# Patient Record
Sex: Female | Born: 2000 | Race: Black or African American | Hispanic: No | Marital: Single | Smoking: Current some day smoker
Health system: Southern US, Community
[De-identification: ages and names within clinical notes are randomized; demographics above are authoritative.]

## PROBLEM LIST (undated history)

## (undated) HISTORY — PX: TONSILLECTOMY: SUR1361

---
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
## 2018-03-02 ENCOUNTER — Encounter (HOSPITAL_COMMUNITY): Payer: Self-pay | Admitting: Emergency Medicine

## 2018-03-02 ENCOUNTER — Emergency Department (HOSPITAL_COMMUNITY)
Admission: EM | Admit: 2018-03-02 | Discharge: 2018-03-02 | Disposition: A | Discharge status: Home / Self Care | Payer: Federal, State, Local not specified - PPO | Attending: Emergency Medicine | Admitting: Emergency Medicine

## 2018-03-02 DIAGNOSIS — Z79899 Other long term (current) drug therapy: Secondary | ICD-10-CM | POA: Insufficient documentation

## 2018-03-02 DIAGNOSIS — N390 Urinary tract infection, site not specified: Secondary | ICD-10-CM | POA: Diagnosis not present

## 2018-03-02 DIAGNOSIS — R300 Dysuria: Secondary | ICD-10-CM | POA: Diagnosis present

## 2018-03-02 DIAGNOSIS — Z3202 Encounter for pregnancy test, result negative: Secondary | ICD-10-CM | POA: Diagnosis not present

## 2018-03-02 LAB — URINALYSIS, ROUTINE W REFLEX MICROSCOPIC
Bacteria, UA: NONE SEEN
Bilirubin Urine: NEGATIVE
Glucose, UA: NEGATIVE mg/dL
KETONES UR: NEGATIVE mg/dL
Nitrite: NEGATIVE
PROTEIN: 100 mg/dL — AB
Specific Gravity, Urine: 1.02 (ref 1.005–1.030)
pH: 6 (ref 5.0–8.0)

## 2018-03-02 LAB — PREGNANCY, URINE: Preg Test, Ur: NEGATIVE

## 2018-03-02 MED ORDER — NITROFURANTOIN MONOHYD MACRO 100 MG PO CAPS
100.0000 mg | ORAL_CAPSULE | Freq: Once | ORAL | Status: AC
  Administered 2018-03-02: 100 mg via ORAL
  Filled 2018-03-02: qty 1

## 2018-03-02 MED ORDER — NITROFURANTOIN MONOHYD MACRO 100 MG PO CAPS: 100.0000 mg | ORAL_CAPSULE | Freq: Two times a day (BID) | ORAL | 0 refills | Status: DC

## 2018-03-02 NOTE — ED Notes (Signed)
Bladder scan 0mL.

## 2018-03-02 NOTE — ED Notes (Signed)
Patient verbalizes understanding of discharge instructions. Opportunity for questioning and answers were provided. Armband removed by staff, pt discharged from ED ambulatory.   

## 2018-03-02 NOTE — ED Triage Notes (Signed)
Pt presents to ED for assessment of lower abdominal pain, dysuria, urinary frequency.  Denies discharge, denies blood in urine, denies fevers.

## 2018-03-02 NOTE — Discharge Instructions (Addendum)
You may have diarrhea from the antibiotics.  It is very important that you continue to take the antibiotics even if you get diarrhea unless a medical professional tells you that you may stop taking them.  If you stop too early the bacteria you are being treated for will become stronger and you may need different, more powerful antibiotics that have more side effects and worsening diarrhea.  Please stay well hydrated and consider probiotics as they may decrease the severity of your diarrhea.  Please be aware that if you take any hormonal contraception (birth control pills, nexplanon, the ring, etc) that your birth control will not work while you are taking antibiotics and you need to use back up protection as directed on the birth control medication information insert.   As we discussed you can get Azo from the drugstore, this will turn your urine bright orange however it will help with the discomfort from urinating.  Fevers, back pain up higher, or have any concerns please seek additional medical care.  You should start feeling better in the next 1 to 2 days.  If you do not then please get checked again.

## 2018-03-02 NOTE — ED Provider Notes (Signed)
[facility] EMERGENCY DEPARTMENT Provider Note   CSN: [id number] Arrival date & time: 03/02/18  1918     History   Chief Complaint Chief Complaint  Patient presents with  . Dysuria    HPI Shelby Blevins is a 17 y.o. female with no significant past medical history who presents today for evaluation of dysuria.  She reports that for approximately 1 week she has been having pain with urination.  She reports urinary frequency, urgency, and feeling that she is unable to fully empty her bladder.  She says that she has had urinary tract infections in the past however she has treated them at home with cranberry juice and increase fluids and they have gone away.  She reports that she is not sexually active, denies any vaginal discharge.  She does report blood in her urine.  Denies back pain or fevers.  HPI  History reviewed. No pertinent past medical history.  There are no active problems to display for this patient.   Past Surgical History:  Procedure Laterality Date  . TONSILLECTOMY       OB History   None      Home Medications    Prior to Admission medications   Medication Sig Start Date End Date Taking? Authorizing Provider  acetaminophen (TYLENOL) 325 MG tablet Take 2 tablets (650 mg total) by mouth every 6 (six) hours as needed for mild pain or moderate pain. 08/08/17  Yes Mathews Robinsons B, PA-C  Multiple Vitamins-Minerals (MULTI-DAY PLUS MINERALS) TABS Take 1 tablet by mouth daily.   Yes [provider]  naproxen sodium (ALEVE) 220 MG tablet Take 220 mg by mouth as needed (pain).   Yes [provider]  diclofenac (VOLTAREN) 50 MG EC tablet Take 1 tablet (50 mg total) 2 (two) times daily by mouth. Take with food Patient not taking: Reported on 03/02/2018 02/19/17   Hayden Rasmussen, NP  ibuprofen (ADVIL,MOTRIN) 600 MG tablet Take 1 tablet (600 mg total) by mouth every 6 (six) hours as needed. Patient not taking: Reported on 03/02/2018 08/08/17    Georgiana Shore, PA-C  nitrofurantoin, macrocrystal-monohydrate, (MACROBID) 100 MG capsule Take 1 capsule (100 mg total) by mouth 2 (two) times daily. 03/02/18   Cristina Gong, PA-C    Family History History reviewed. No pertinent family history.  Social History Social History   Tobacco Use  . Smoking status: Never Smoker  . Smokeless tobacco: Never Used  Substance Use Topics  . Alcohol use: No    Frequency: Never  . Drug use: No     Allergies   Patient has no known allergies.   Review of Systems Review of Systems  Constitutional: Negative for chills and fever.  Genitourinary: Positive for dysuria, frequency and urgency. Negative for flank pain, menstrual problem, vaginal bleeding, vaginal discharge and vaginal pain.       No true pelvic pain, however discomfort is present.  Musculoskeletal: Negative for back pain.  All other systems reviewed and are negative.    Physical Exam Updated Vital Signs BP 105/64   Pulse 66   Temp 98 F (36.7 C) (Oral)   Resp 14   SpO2 99%   Physical Exam  Constitutional: She appears well-developed and well-nourished. No distress.  HENT:  Head: Normocephalic and atraumatic.  Eyes: Conjunctivae are normal. Right eye exhibits no discharge. Left eye exhibits no discharge. No scleral icterus.  Neck: Normal range of motion.  Cardiovascular: Normal rate and regular rhythm.  Pulmonary/Chest: Effort normal. No  stridor. No respiratory distress.  Abdominal: Soft. Bowel sounds are normal. She exhibits no distension. There is no tenderness. There is no guarding.  No CVA TTPercussion  Musculoskeletal: She exhibits no edema or deformity.  Neurological: She is alert. She exhibits normal muscle tone.  Patient is able to carry on a normal conversation.  Answers questions appropriately.  Skin: Skin is warm and dry. She is not diaphoretic.  Psychiatric: She has a normal mood and affect. Her behavior is normal.  Nursing note and vitals  reviewed.    ED Treatments / Results  Labs (all labs ordered are listed, but only abnormal results are displayed) Labs Reviewed  URINALYSIS, ROUTINE W REFLEX MICROSCOPIC - Abnormal; Notable for the following components:      Result Value   APPearance CLOUDY (*)    Hgb urine dipstick LARGE (*)    Protein, ur 100 (*)    Leukocytes, UA LARGE (*)    RBC / HPF >50 (*)    WBC, UA >50 (*)    All other components within normal limits  PREGNANCY, URINE    EKG None  Radiology No results found.  Procedures Procedures (including critical care time)  Medications Ordered in ED Medications  nitrofurantoin (macrocrystal-monohydrate) (MACROBID) capsule 100 mg (100 mg Oral Given 03/02/18 2139)     Initial Impression / Assessment and Plan / ED Course  I have reviewed the triage vital signs and the nursing notes.  Pertinent labs & imaging results that were available during my care of the patient were reviewed by me and considered in my medical decision making (see chart for details).    Pt has been diagnosed with a UTI. Pt is afebrile, no CVA tenderness, normotensive, and denies N/V. Pt to be dc home with antibiotics and instructions to follow up with PCP if symptoms persist.  Due to feeling unable to empty bladder bladder scan performed showing zero.    Final Clinical Impressions(s) / ED Diagnoses   Final diagnoses:  Lower urinary tract infectious disease    ED Discharge Orders         Ordered    nitrofurantoin, macrocrystal-monohydrate, (MACROBID) 100 MG capsule  2 times daily     03/02/18 2125           Cristina GongHammond, Delmos Velaquez W, [place]PA-C 03/03/18 0050    Linwood DibblesKnapp, Jon, MD 03/05/18 1500

## 2018-05-13 ENCOUNTER — Emergency Department (HOSPITAL_COMMUNITY)
Admission: EM | Admit: 2018-05-13 | Discharge: 2018-05-13 | Disposition: A | Discharge status: Home / Self Care | Payer: Federal, State, Local not specified - PPO | Attending: Emergency Medicine | Admitting: Emergency Medicine

## 2018-05-13 DIAGNOSIS — R51 Headache: Secondary | ICD-10-CM | POA: Diagnosis not present

## 2018-05-13 DIAGNOSIS — Z79899 Other long term (current) drug therapy: Secondary | ICD-10-CM | POA: Diagnosis not present

## 2018-05-13 DIAGNOSIS — J069 Acute upper respiratory infection, unspecified: Secondary | ICD-10-CM | POA: Insufficient documentation

## 2018-05-13 DIAGNOSIS — J029 Acute pharyngitis, unspecified: Secondary | ICD-10-CM | POA: Diagnosis not present

## 2018-05-13 DIAGNOSIS — R05 Cough: Secondary | ICD-10-CM | POA: Insufficient documentation

## 2018-05-13 DIAGNOSIS — R112 Nausea with vomiting, unspecified: Secondary | ICD-10-CM | POA: Diagnosis not present

## 2018-05-13 DIAGNOSIS — R0981 Nasal congestion: Secondary | ICD-10-CM | POA: Diagnosis present

## 2018-05-13 DIAGNOSIS — B9789 Other viral agents as the cause of diseases classified elsewhere: Secondary | ICD-10-CM

## 2018-05-13 MED ORDER — BENZONATATE 100 MG PO CAPS: 100.0000 mg | ORAL_CAPSULE | Freq: Three times a day (TID) | ORAL | 0 refills | Status: DC

## 2018-05-13 MED ORDER — IBUPROFEN 800 MG PO TABS
800.0000 mg | ORAL_TABLET | Freq: Once | ORAL | Status: AC
  Administered 2018-05-13: 800 mg via ORAL
  Filled 2018-05-13: qty 1

## 2018-05-13 MED ORDER — ONDANSETRON HCL 4 MG PO TABS: 4.0000 mg | ORAL_TABLET | Freq: Four times a day (QID) | ORAL | 0 refills | Status: DC

## 2018-05-13 NOTE — Discharge Instructions (Addendum)
You have been seen today for cough, congestion, and sore throat. Please read and follow all provided instructions.   1. Medications: Tessalon for cough, zofran for nausea, usual home medications 2. Treatment: rest, drink plenty of fluids 3. Follow Up: Please follow up with your primary doctor in 7 days for discussion of your diagnoses and further evaluation after today's visit; if you do not have a primary care doctor use the resource guide provided to find one; Please return to the ER for any new or worsening symptoms. Please obtain all of your results from medical records or have your doctors office obtain the results - share them with your doctor - you should be seen at your doctors office. Call today to arrange your follow up.   Take medications as prescribed. Please review all of the medicines and only take them if you do not have an allergy to them. Return to the emergency room for worsening condition or new concerning symptoms. Follow up with your regular doctor. If you don't have a regular doctor use one of the numbers below to establish a primary care doctor.  Please be aware that if you are taking birth control pills, taking other prescriptions, ESPECIALLY ANTIBIOTICS may make the birth control ineffective - if this is the case, either do not engage in sexual activity or use alternative methods of birth control such as condoms until you have finished the medicine and your family doctor says it is OK to restart them. If you are on a blood thinner such as COUMADIN, be aware that any other medicine that you take may cause the coumadin to either work too much, or not enough - you should have your coumadin level rechecked in next 7 days if this is the case.  ?  It is also a possibility that you have an allergic reaction to any of the medicines that you have been prescribed - Everybody reacts differently to medications and while MOST people have no trouble with most medicines, you may have a reaction  such as nausea, vomiting, rash, swelling, shortness of breath. If this is the case, please stop taking the medicine immediately and contact your physician.  ?  You should return to the ER if you develop severe or worsening symptoms.   Emergency Department Resource Guide 1) Find a Doctor and Pay Out of Pocket Although you won't have to find out who is covered by your insurance plan, it is a good idea to ask around and get recommendations. You will then need to call the office and see if the doctor you have chosen will accept you as a new patient and what types of options they offer for patients who are self-pay. Some doctors offer discounts or will set up payment plans for their patients who do not have insurance, but you will need to ask so you aren't surprised when you get to your appointment.  2) Contact Your Local Health Department Not all health departments have doctors that can see patients for sick visits, but many do, so it is worth a call to see if yours does. If you don't know where your local health department is, you can check in your phone book. The CDC also has a tool to help you locate your state's health department, and many state websites also have listings of all of their local health departments.  3) Find a Walk-in Clinic If your illness is not likely to be very severe or complicated, you may want to try a  walk in clinic. These are popping up all over the country in pharmacies, drugstores, and shopping centers. They're usually staffed by nurse practitioners or physician assistants that have been trained to treat common illnesses and complaints. They're usually fairly quick and inexpensive. However, if you have serious medical issues or chronic medical problems, these are probably not your best option.  No Primary Care Doctor: Call Health Connect at  [phone] - they can help you locate a primary care doctor that  accepts your insurance, provides certain services, etc. Physician  Referral [phone]  Emergency Department Resource Guide 1) Find a Doctor and Pay Out of Pocket Although you won't have to find out who is covered by your insurance plan, it is a good idea to ask around and get recommendations. You will then need to call the office and see if the doctor you have chosen will accept you as a new patient and what types of options they offer for patients who are self-pay. Some doctors offer discounts or will set up payment plans for their patients who do not have insurance, but you will need to ask so you aren't surprised when you get to your appointment.  2) Contact Your Local Health Department Not all health departments have doctors that can see patients for sick visits, but many do, so it is worth a call to see if yours does. If you don't know where your local health department is, you can check in your phone book. The CDC also has a tool to help you locate your state's health department, and many state websites also have listings of all of their local health departments.  3) Find a Walk-in Clinic If your illness is not likely to be very severe or complicated, you may want to try a walk in clinic. These are popping up all over the country in pharmacies, drugstores, and shopping centers. They're usually staffed by nurse practitioners or physician assistants that have been trained to treat common illnesses and complaints. They're usually fairly quick and inexpensive. However, if you have serious medical issues or chronic medical problems, these are probably not your best option.  No Primary Care Doctor: Call Health Connect at  [phone] - they can help you locate a primary care doctor that  accepts your insurance, provides certain services, etc. Physician Referral Service- [phone]  Chronic Pain Problems: Organization         Address  Phone   Notes  [facility]  [phone] Patients need to be referred by their primary care  doctor.   Medication Assistance: Organization         Address  Phone   Notes  [facility] Medication [facility] [address] [place] [place], [place] [address] [phone] --Must be a resident of [facility] -- Must have NO insurance coverage whatsoever (no Medicaid/ Medicare, etc.) -- The pt. MUST have a primary care doctor that directs their care regularly and follows them in the community   [facility]  [phone]   [employer]  [phone]    Agencies that provide inexpensive medical care: Organization         Address  Phone   Notes  [facility]  [phone]   [facility]    [phone]   [facility] [address] [place], [place] [address] [phone]   [facility] [address] [place], [place] [phone]   [facility]    [phone]   [facility]    [phone]   [facility]  [address], [place] Phone:  [phone], Fax:  [phone] Hours of Operation:  9 am - 6 pm, M-F.  Also accepts Medicaid/Medicare and self-pay.  [facility]  [address], [place] Phone: [phone], Fax: [phone]. Hours of Operation:  8:30 am - 5:30 pm, M-F.  Also accepts Medicaid and self-pay.  [facility] [place] [address] [place] Phone: [phone]   [facility] [address] Natasha Bence [place], [place] [phone], Ext. [phone] Mondays & Thursdays: 7-9 AM.  First 15 patients are seen on a first come, first serve basis.    Medicaid-accepting [facility] Providers:  Organization         Address  Phone   Notes  [facility] [address], [place] [phone] Also accepts self-pay patients.  [facility] [address] Laurell Josephs [place], [place]  [phone]   [facility] [address], [place] [phone]   [facility] [address], [place] [phone]   Renaye Rakers [address], [place]   [phone] Only accepts [place] patients after they have their name applied to their card.   Self-Pay (no insurance) in [facility]:  Organization         Address  Phone   Notes  Sickle Cell Patients, [facility] Internal [facility] [address] [place], [place] [phone]   [facility] [address] [place], [place] [phone]   [facility] [place] [place]  [address], [place] [phone]   [facility]/Dr. Osei-Bonsu  [address] [place] or [address], [place] [phone] Phone number for both [place] and [place] locations is the same.  [facility] and [facility] [address], [place] [phone]   [facility] [address], [place] or [address] [phone]   [facility] [address] [place] [phone], phone; [phone], fax Sees patients 1st and 3rd Saturday of every month.  Must not qualify for public or private insurance (i.e. Medicaid, Medicare, [place] Health Choice, Veterans' Benefits)  Household income should be no more than 200% of the poverty level The clinic cannot treat you if you are pregnant or think you are pregnant  Sexually transmitted diseases are not treated at the clinic.

## 2018-05-13 NOTE — ED Triage Notes (Signed)
Pt arrives with complaints of nasal congestion and productive cough with green sputum in the mornings when she wakes up. Pt has been trying nasal sprays at home with little relief.

## 2018-05-13 NOTE — ED Provider Notes (Signed)
[facility] EMERGENCY DEPARTMENT Provider Note   CSN: [id number] Arrival date & time: 05/13/18  1651     History   Chief Complaint Chief Complaint  Patient presents with  . Nasal Congestion    HPI Shelby Blevins is a 18 y.o. female presenting with nasal congestion, sore throat, productive cough, and body aches onset 5 days ago. Patient reports she has taken OTC cough medicine and nasal spray without relief. Patient reports symptoms are worse in the morning and improve throughout the day. Patient denies sick contacts. Patient denies any medical problems. Patient reports associated intermittent nausea, vomiting, and one episode of diarrhea. Patient denies recent travel or antibiotic use. Patient reports associated mild frontal headache that she describes as an ache and states it is similar to headaches in the past. Patient denies vision changes, weakness, or numbness. Patient reports loss of appetite since the onset of symptoms but states she is still able to drink and eat without difficulty. Patient states she did not get her influenza vaccine. Patient denies fever, abdominal pain, chest pain, shortness of breath, dysuria, or urinary frequency.   HPI  No past medical history on file.  There are no active problems to display for this patient.   Past Surgical History:  Procedure Laterality Date  . TONSILLECTOMY       OB History   No obstetric history on file.      Home Medications    Prior to Admission medications   Medication Sig Start Date End Date Taking? Authorizing Provider  acetaminophen (TYLENOL) 325 MG tablet Take 2 tablets (650 mg total) by mouth every 6 (six) hours as needed for mild pain or moderate pain. 08/08/17   Georgiana Shore, PA-C  benzonatate (TESSALON) 100 MG capsule Take 1 capsule (100 mg total) by mouth every 8 (eight) hours. 05/13/18   Carlyle Basques P, PA-C  diclofenac (VOLTAREN) 50 MG EC tablet Take 1 tablet (50 mg total) 2 (two)  times daily by mouth. Take with food Patient not taking: Reported on 03/02/2018 02/19/17   Hayden Rasmussen, NP  ibuprofen (ADVIL,MOTRIN) 600 MG tablet Take 1 tablet (600 mg total) by mouth every 6 (six) hours as needed. Patient not taking: Reported on 03/02/2018 08/08/17   Georgiana Shore, PA-C  Multiple Vitamins-Minerals (MULTI-DAY PLUS MINERALS) TABS Take 1 tablet by mouth daily.    [provider]  naproxen sodium (ALEVE) 220 MG tablet Take 220 mg by mouth as needed (pain).    [provider]  nitrofurantoin, macrocrystal-monohydrate, (MACROBID) 100 MG capsule Take 1 capsule (100 mg total) by mouth 2 (two) times daily. 03/02/18   Cristina Gong, PA-C  ondansetron (ZOFRAN) 4 MG tablet Take 1 tablet (4 mg total) by mouth every 6 (six) hours. 05/13/18   Leretha Dykes, PA-C    Family History No family history on file.  Social History Social History   Tobacco Use  . Smoking status: Never Smoker  . Smokeless tobacco: Never Used  Substance Use Topics  . Alcohol use: No    Frequency: Never  . Drug use: No     Allergies   Patient has no known allergies.   Review of Systems Review of Systems  Constitutional: Positive for appetite change. Negative for activity change, fatigue and fever.  HENT: Positive for congestion and rhinorrhea. Negative for ear pain, postnasal drip and sore throat.   Eyes: Negative for pain, redness and itching.  Respiratory: Positive for cough. Negative for shortness of breath.  Cardiovascular: Negative for chest pain.  Gastrointestinal: Positive for diarrhea, nausea and vomiting. Negative for abdominal pain.  Genitourinary: Negative for dysuria.  Musculoskeletal: Positive for myalgias.  Skin: Negative for rash.  Allergic/Immunologic: Negative for environmental allergies and immunocompromised state.  Neurological: Positive for headaches. Negative for dizziness and weakness.     Physical Exam Updated Vital Signs BP 116/85 (BP  Location: Right Arm)   Pulse 76   Temp 98.4 F (36.9 C) (Oral)   Resp 15   Ht 5\' 6"  (1.676 m)   Wt 81.6 kg   SpO2 99%   BMI 29.05 kg/m   Physical Exam Vitals signs and nursing note reviewed.  Constitutional:      General: She is not in acute distress.    Appearance: She is well-developed. She is not diaphoretic.  HENT:     Head: Normocephalic and atraumatic.     Right Ear: Tympanic membrane, ear canal and external ear normal. No middle ear effusion.     Left Ear: Tympanic membrane, ear canal and external ear normal.  No middle ear effusion.     Nose: Mucosal edema, congestion and rhinorrhea present.     Mouth/Throat:     Mouth: Mucous membranes are moist.     Pharynx: Uvula midline. No oropharyngeal exudate or posterior oropharyngeal erythema.  Eyes:     General:        Right eye: No discharge.        Left eye: No discharge.     Extraocular Movements: Extraocular movements intact.     Conjunctiva/sclera: Conjunctivae normal.     Pupils: Pupils are equal, round, and reactive to light.  Neck:     Musculoskeletal: Normal range of motion and neck supple.  Cardiovascular:     Rate and Rhythm: Normal rate and regular rhythm.     Heart sounds: Normal heart sounds. No murmur. No friction rub. No gallop.   Pulmonary:     Effort: Pulmonary effort is normal. No respiratory distress.     Breath sounds: Normal breath sounds. No wheezing or rales.  Abdominal:     Palpations: Abdomen is soft.     Tenderness: There is no abdominal tenderness.  Musculoskeletal: Normal range of motion.  Lymphadenopathy:     Cervical: No cervical adenopathy.  Skin:    General: Skin is warm.     Findings: No rash.  Neurological:     Mental Status: She is alert and oriented to person, place, and time.      ED Treatments / Results  Labs (all labs ordered are listed, but only abnormal results are displayed) Labs Reviewed - No data to display  EKG None  Radiology No results  found.  Procedures Procedures (including critical care time)  Medications Ordered in ED Medications  ibuprofen (ADVIL,MOTRIN) tablet 800 mg (800 mg Oral Given 05/13/18 2015)     Initial Impression / Assessment and Plan / ED Course  I have reviewed the triage vital signs and the nursing notes.  Pertinent labs & imaging results that were available during my care of the patient were reviewed by me and considered in my medical decision making (see chart for details).    Patients symptoms are consistent with URI, likely viral etiology. Discussed that antibiotics are not indicated for viral infections. Pt will be discharged with symptomatic treatment.  Verbalizes understanding and is agreeable with plan. Discussed return precautions with patient. Pt is hemodynamically stable & in NAD prior to dc.   Final Clinical Impressions(s) /  ED Diagnoses   Final diagnoses:  Viral URI with cough    ED Discharge Orders         Ordered    benzonatate (TESSALON) 100 MG capsule  Every 8 hours     05/13/18 1903    ondansetron (ZOFRAN) 4 MG tablet  Every 6 hours     05/13/18 1903           Glade StanfordHernandez, Zackarie Chason P, PA-C 05/13/18 2024    Linwood DibblesKnapp, Jon, MD 05/14/18 [phone]

## 2019-04-19 ENCOUNTER — Other Ambulatory Visit: Payer: Self-pay

## 2019-04-19 ENCOUNTER — Ambulatory Visit (HOSPITAL_COMMUNITY)
Admission: EM | Admit: 2019-04-19 | Discharge: 2019-04-19 | Discharge status: Against Medical Advice | Payer: Federal, State, Local not specified - PPO

## 2019-04-19 NOTE — ED Notes (Signed)
Unknown reason for pt leaving the facility.

## 2019-05-20 ENCOUNTER — Encounter (HOSPITAL_COMMUNITY): Payer: Self-pay | Admitting: *Deleted

## 2019-05-20 ENCOUNTER — Emergency Department (HOSPITAL_COMMUNITY)
Admission: EM | Admit: 2019-05-20 | Discharge: 2019-05-20 | Disposition: A | Discharge status: Home / Self Care | Payer: Federal, State, Local not specified - PPO | Attending: Emergency Medicine | Admitting: Emergency Medicine

## 2019-05-20 ENCOUNTER — Other Ambulatory Visit: Payer: Self-pay

## 2019-05-20 DIAGNOSIS — F17200 Nicotine dependence, unspecified, uncomplicated: Secondary | ICD-10-CM | POA: Insufficient documentation

## 2019-05-20 DIAGNOSIS — R3915 Urgency of urination: Secondary | ICD-10-CM | POA: Diagnosis present

## 2019-05-20 DIAGNOSIS — N3001 Acute cystitis with hematuria: Secondary | ICD-10-CM

## 2019-05-20 LAB — URINALYSIS, ROUTINE W REFLEX MICROSCOPIC
Bilirubin Urine: NEGATIVE
Glucose, UA: NEGATIVE mg/dL
Ketones, ur: NEGATIVE mg/dL
Leukocytes,Ua: NEGATIVE
Nitrite: POSITIVE — AB
Protein, ur: 100 mg/dL — AB
Specific Gravity, Urine: 1.017 (ref 1.005–1.030)
WBC, UA: >50 WBC/hpf — ABNORMAL HIGH (ref 0–5)
pH: 5 (ref 5.0–8.0)

## 2019-05-20 LAB — POC URINE PREG, ED: Preg Test, Ur: NEGATIVE

## 2019-05-20 MED ORDER — CIPROFLOXACIN HCL 500 MG PO TABS: 500.0000 mg | ORAL_TABLET | Freq: Two times a day (BID) | ORAL | 0 refills | Status: AC

## 2019-05-20 NOTE — ED Triage Notes (Signed)
States she was treated for UTI and finished antibiotics  2weeks ago, states she started having sx. Again on tues. C/o incont. Of urine and abd. Pain

## 2019-05-20 NOTE — Discharge Instructions (Addendum)
As discussed, your urine showed you have another urinary tract infection.  I am sending you home with a different antibiotic called ciprofloxacin.  Take twice a day for 7 days.  I have sent for urine culture.  You will be called if the antibiotic that I prescribed you needs to be changed. Follow-up with PCP if symptoms do not improve within the next week. Return to the ER for new or worsening symptoms.

## 2019-05-20 NOTE — ED Provider Notes (Signed)
[facility] EMERGENCY DEPARTMENT Provider Note   CSN: [id number] Arrival date & time: 05/20/19  1227     History Chief Complaint  Patient presents with  . Urinary Tract Infection    Shelby Blevins is a 19 y.o. female with no significant past medical history who presents to the ED due to increased urinary urgency and frequency since Tuesday.  Patient notes she was treated for UTI on 04/20/2019 where she was prescribed Macrobid.  Patient states she finished all antibiotics and took as prescribed; however, she notes her symptoms reappeared 4 days ago.  Patient is currently sexually active with her girlfriend and has no concern for STDs at this time.  She has an IUD for birth control.  Her last menstrual cycle was 04/03/2019.  Patient has been taking AZO with moderate relief.  Patient admits to associative left low back pain and nausea.  Patient denies fever, chills, abdominal pain, suprapubic pain, vomiting, chest pain, shortness of breath.  Patient denies increase in vaginal discharge and vaginal pain.    History reviewed. No pertinent past medical history.  There are no problems to display for this patient.   Past Surgical History:  Procedure Laterality Date  . TONSILLECTOMY       OB History   No obstetric history on file.     No family history on file.  Social History   Tobacco Use  . Smoking status: Current Some Day Smoker  . Smokeless tobacco: Never Used  Substance Use Topics  . Alcohol use: Yes  . Drug use: No    Home Medications Prior to Admission medications   Medication Sig Start Date End Date Taking? Authorizing Provider  acetaminophen (TYLENOL) 325 MG tablet Take 2 tablets (650 mg total) by mouth every 6 (six) hours as needed for mild pain or moderate pain. 08/08/17   Georgiana Shore, PA-C  benzonatate (TESSALON) 100 MG capsule Take 1 capsule (100 mg total) by mouth every 8 (eight) hours. 05/13/18   Carlyle Basques P, PA-C  ciprofloxacin (CIPRO)  500 MG tablet Take 1 tablet (500 mg total) by mouth every 12 (twelve) hours for 7 days. 05/20/19 05/27/19  Mannie Stabile, PA-C  diclofenac (VOLTAREN) 50 MG EC tablet Take 1 tablet (50 mg total) 2 (two) times daily by mouth. Take with food Patient not taking: Reported on 03/02/2018 02/19/17   Hayden Rasmussen, NP  ibuprofen (ADVIL,MOTRIN) 600 MG tablet Take 1 tablet (600 mg total) by mouth every 6 (six) hours as needed. Patient not taking: Reported on 03/02/2018 08/08/17   Georgiana Shore, PA-C  Multiple Vitamins-Minerals (MULTI-DAY PLUS MINERALS) TABS Take 1 tablet by mouth daily.    [provider]  naproxen sodium (ALEVE) 220 MG tablet Take 220 mg by mouth as needed (pain).    [provider]  nitrofurantoin, macrocrystal-monohydrate, (MACROBID) 100 MG capsule Take 1 capsule (100 mg total) by mouth 2 (two) times daily. 03/02/18   Cristina Gong, PA-C  ondansetron (ZOFRAN) 4 MG tablet Take 1 tablet (4 mg total) by mouth every 6 (six) hours. 05/13/18   Leretha Dykes, PA-C    Allergies    Patient has no known allergies.  Review of Systems   Review of Systems  Constitutional: Negative for chills and fever.  Gastrointestinal: Positive for nausea. Negative for abdominal pain, diarrhea and vomiting.  Genitourinary: Positive for decreased urine volume and urgency. Negative for dysuria and hematuria.  Musculoskeletal: Positive for back pain.  All other systems reviewed and  are negative.   Physical Exam Updated Vital Signs BP 113/71 (BP Location: Left Arm)   Pulse 84   Temp 98.1 F (36.7 C) (Oral)   Resp 20   Ht 5\' 6"  (1.676 m)   Wt 95.3 kg   SpO2 100%   BMI 33.89 kg/m   Physical Exam Vitals and nursing note reviewed.  Constitutional:      General: She is not in acute distress.    Appearance: She is not ill-appearing.     Comments: Well appearing 19 year old.   HENT:     Head: Normocephalic.  Eyes:     Pupils: Pupils are equal, round, and reactive to  light.  Cardiovascular:     Rate and Rhythm: Normal rate and regular rhythm.     Pulses: Normal pulses.     Heart sounds: Normal heart sounds. No murmur. No friction rub. No gallop.   Pulmonary:     Effort: Pulmonary effort is normal.     Breath sounds: Normal breath sounds.  Abdominal:     General: Abdomen is flat. Bowel sounds are normal. There is no distension.     Palpations: Abdomen is soft.     Tenderness: There is no abdominal tenderness. There is no right CVA tenderness, left CVA tenderness, guarding or rebound.     Comments: Negative bilateral CVA tenderness.  Abdomen soft, nondistended, nontender.  No peritoneal signs.  Genitourinary:    Comments: Patient deferred. Musculoskeletal:     Cervical back: Neck supple.     Comments: Able to move all 4 extremities without difficulty.  Skin:    General: Skin is warm and dry.  Neurological:     General: No focal deficit present.     Mental Status: She is alert.     ED Results / Procedures / Treatments   Labs (all labs ordered are listed, but only abnormal results are displayed) Labs Reviewed  URINALYSIS, ROUTINE W REFLEX MICROSCOPIC - Abnormal; Notable for the following components:      Result Value   Color, Urine AMBER (*)    APPearance HAZY (*)    Hgb urine dipstick MODERATE (*)    Protein, ur 100 (*)    Nitrite POSITIVE (*)    WBC, UA >50 (*)    Bacteria, UA FEW (*)    All other components within normal limits  URINE CULTURE  POC URINE PREG, ED    EKG None  Radiology No results found.  Procedures Procedures (including critical care time)  Medications Ordered in ED Medications - No data to display  ED Course  I have reviewed the triage vital signs and the nursing notes.  Pertinent labs & imaging results that were available during my care of the patient were reviewed by me and considered in my medical decision making (see chart for details).    MDM Rules/Calculators/A&P                     19 year old  female presents to the ED due to increased urinary urgency and frequency.  Patient was treated for a UTI on 04/20/2019 in which she finished all antibiotics as prescribed, but her symptoms returned 4 days ago.  Vitals all within normal limits.  Patient is afebrile, not tachycardic or hypoxic.  Patient in no acute distress and non-ill-appearing.  Physical exam reassuring.  Negative CVA tenderness bilaterally. Doubt pyelonephritis. Abdomen soft, nondistended, nontender.  Pelvic exam deferred. Unable to assess for STDs. UA significant for nitrites and  proteinuria with few bacteria.  Urine culture pending.  Suspect patient has another UTI.  Will treat with ciprofloxacin given the recurrence.  Instructed patient to follow-up with PCP within the next week if symptoms not improve. Strict ED precautions discussed with patient. Patient states understanding and agrees to plan. Patient discharged home in no acute distress and stable vitals  Final Clinical Impression(s) / ED Diagnoses Final diagnoses:  Acute cystitis with hematuria    Rx / DC Orders ED Discharge Orders         Ordered    ciprofloxacin (CIPRO) 500 MG tablet  Every 12 hours     05/20/19 1516           Karie Kirks 05/20/19 1528    Blanchie Dessert, MD 05/23/19 [phone]

## 2019-05-22 LAB — URINE CULTURE: Culture: <10000 — AB

## 2019-10-12 ENCOUNTER — Emergency Department (HOSPITAL_COMMUNITY)
Admission: EM | Admit: 2019-10-12 | Discharge: 2019-10-12 | Disposition: A | Discharge status: Home / Self Care | Payer: Federal, State, Local not specified - PPO | Attending: Pediatric Emergency Medicine | Admitting: Pediatric Emergency Medicine

## 2019-10-12 ENCOUNTER — Emergency Department (HOSPITAL_COMMUNITY): Payer: Federal, State, Local not specified - PPO

## 2019-10-12 ENCOUNTER — Other Ambulatory Visit: Payer: Self-pay

## 2019-10-12 ENCOUNTER — Encounter (HOSPITAL_COMMUNITY): Payer: Self-pay | Admitting: *Deleted

## 2019-10-12 DIAGNOSIS — Y999 Unspecified external cause status: Secondary | ICD-10-CM | POA: Diagnosis not present

## 2019-10-12 DIAGNOSIS — Z79899 Other long term (current) drug therapy: Secondary | ICD-10-CM | POA: Insufficient documentation

## 2019-10-12 DIAGNOSIS — Y929 Unspecified place or not applicable: Secondary | ICD-10-CM | POA: Insufficient documentation

## 2019-10-12 DIAGNOSIS — X500XXA Overexertion from strenuous movement or load, initial encounter: Secondary | ICD-10-CM | POA: Insufficient documentation

## 2019-10-12 DIAGNOSIS — S8391XA Sprain of unspecified site of right knee, initial encounter: Secondary | ICD-10-CM | POA: Insufficient documentation

## 2019-10-12 DIAGNOSIS — Y939 Activity, unspecified: Secondary | ICD-10-CM | POA: Diagnosis not present

## 2019-10-12 DIAGNOSIS — S8991XA Unspecified injury of right lower leg, initial encounter: Secondary | ICD-10-CM | POA: Diagnosis present

## 2019-10-12 DIAGNOSIS — F17210 Nicotine dependence, cigarettes, uncomplicated: Secondary | ICD-10-CM | POA: Insufficient documentation

## 2019-10-12 NOTE — ED Triage Notes (Signed)
Pt reports her right knee becoming dislocated this am 0200 while working. Hx of same and was able to get it back into joint but still has pain. Ambulatory at triage.

## 2019-10-12 NOTE — ED Provider Notes (Signed)
[facility] EMERGENCY DEPARTMENT Provider Note   CSN: [id number] Arrival date & time: 10/12/19  0932     History Chief Complaint  Patient presents with  . Knee Pain    Shelby Blevins is a 19 y.o. female with knee injury 8hr prior.  History of knee laxity and injury with minor trauma of increasing frequency stepped off truck 8hr prior to evaluation and noted popping to R knee.  Appeared displaced to patient and "went back to normal" but continued pain so presents.    The history is provided by the patient.  Knee Pain Location:  Knee Time since incident:  8 hours Injury: yes   Mechanism of injury: fall   Fall:    Fall occurred:  Down stairs (stairs) Knee location:  R knee Pain details:    Quality:  Aching   Radiates to:  Does not radiate   Severity:  Moderate   Onset quality:  Gradual   Timing:  Constant   Progression:  Unchanged Chronicity:  Recurrent Dislocation: yes   Foreign body present:  No foreign bodies Prior injury to area:  Yes Relieved by:  None tried Worsened by:  Nothing Ineffective treatments:  None tried Associated symptoms: stiffness and swelling   Associated symptoms: no back pain, no fever, no muscle weakness, no neck pain, no numbness and no tingling   Risk factors: no frequent fractures and no known bone disorder        History reviewed. No pertinent past medical history.  There are no problems to display for this patient.   Past Surgical History:  Procedure Laterality Date  . TONSILLECTOMY       OB History   No obstetric history on file.     History reviewed. No pertinent family history.  Social History   Tobacco Use  . Smoking status: Current Some Day Smoker  . Smokeless tobacco: Never Used  Substance Use Topics  . Alcohol use: Yes  . Drug use: No    Home Medications Prior to Admission medications   Medication Sig Start Date End Date Taking? Authorizing Provider  acetaminophen (TYLENOL) 325 MG tablet Take  2 tablets (650 mg total) by mouth every 6 (six) hours as needed for mild pain or moderate pain. 08/08/17   Georgiana Shore, PA-C  benzonatate (TESSALON) 100 MG capsule Take 1 capsule (100 mg total) by mouth every 8 (eight) hours. 05/13/18   Carlyle Basques P, PA-C  diclofenac (VOLTAREN) 50 MG EC tablet Take 1 tablet (50 mg total) 2 (two) times daily by mouth. Take with food Patient not taking: Reported on 03/02/2018 02/19/17   Hayden Rasmussen, NP  ibuprofen (ADVIL,MOTRIN) 600 MG tablet Take 1 tablet (600 mg total) by mouth every 6 (six) hours as needed. Patient not taking: Reported on 03/02/2018 08/08/17   Georgiana Shore, PA-C  Multiple Vitamins-Minerals (MULTI-DAY PLUS MINERALS) TABS Take 1 tablet by mouth daily.    [provider]  naproxen sodium (ALEVE) 220 MG tablet Take 220 mg by mouth as needed (pain).    [provider]  nitrofurantoin, macrocrystal-monohydrate, (MACROBID) 100 MG capsule Take 1 capsule (100 mg total) by mouth 2 (two) times daily. 03/02/18   Cristina Gong, PA-C  ondansetron (ZOFRAN) 4 MG tablet Take 1 tablet (4 mg total) by mouth every 6 (six) hours. 05/13/18   Leretha Dykes, PA-C    Allergies    Patient has no known allergies.  Review of Systems   Review of Systems  Constitutional: Positive for activity change. Negative for fever.  Musculoskeletal: Positive for arthralgias, gait problem, myalgias and stiffness. Negative for back pain and neck pain.  All other systems reviewed and are negative.   Physical Exam Updated Vital Signs BP 105/70 (BP Location: Left Arm)   Pulse 80   Temp 98.7 F (37.1 C) (Oral)   Resp 18   LMP 09/12/2019   SpO2 100%   Physical Exam Vitals and nursing note reviewed.  Constitutional:      General: She is not in acute distress.    Appearance: She is well-developed.  HENT:     Head: Normocephalic and atraumatic.  Eyes:     Conjunctiva/sclera: Conjunctivae normal.  Cardiovascular:     Rate and Rhythm:  Normal rate and regular rhythm.     Heart sounds: No murmur heard.   Pulmonary:     Effort: Pulmonary effort is normal. No respiratory distress.     Breath sounds: Normal breath sounds.  Abdominal:     Palpations: Abdomen is soft.     Tenderness: There is no abdominal tenderness.  Musculoskeletal:        General: Swelling and tenderness present.     Cervical back: Normal range of motion and neck supple. No rigidity or tenderness.     Comments: ROM at R knee limited 2/2 pain, pain with passive ROM, no laxity appreciated, distal sensation intact, 2+ DP pulse, 2 sec cap refill  Skin:    General: Skin is warm and dry.     Capillary Refill: Capillary refill takes less than 2 seconds.  Neurological:     General: No focal deficit present.     Mental Status: She is alert and oriented to person, place, and time.     Gait: Gait abnormal.     ED Results / Procedures / Treatments   Labs (all labs ordered are listed, but only abnormal results are displayed) Labs Reviewed - No data to display  EKG None  Radiology DG Knee Complete 4 Views Right  Result Date: 10/12/2019 CLINICAL DATA:  Knee pain. EXAM: RIGHT KNEE - COMPLETE 4+ VIEW COMPARISON:  None. FINDINGS: No evidence of fracture, dislocation, or joint effusion. No evidence of arthropathy or other focal bone abnormality. Soft tissues are unremarkable. IMPRESSION: Negative evaluation of the RIGHT knee. Electronically Signed   By: Donzetta Kohut M.D.   On: 10/12/2019 10:31    Procedures Procedures (including critical care time)  Medications Ordered in ED Medications - No data to display  ED Course  I have reviewed the triage vital signs and the nursing notes.  Pertinent labs & imaging results that were available during my care of the patient were reviewed by me and considered in my medical decision making (see chart for details).    MDM Rules/Calculators/A&P                           Pt is a 18yo without pertinent PMHX who  presents w/ a knee injury.   Hemodynamically appropriate and stable on room air with normal saturations.  Lungs clear to auscultation bilaterally good air exchange.  Normal cardiac exam.  Benign abdomen.  No hip pain or ankle pain bilaterally.  R knee swollen tender to palpation  Patient has no obvious deformity on exam. Patient neurovascularly intact - good pulses, full movement - slightly decreased only 2/2 pain. Imaging obtained and resulted above.  Doubt nerve or vascular injury at this time.  No  other injuries appreciated on exam.  Radiology read as above.  No fractures.  I personally reviewed and agree.  Pain control with Motrin here.  Patient placed in knee immobilizer and provided crutches instruction.  D/C home in stable condition. Follow-up with PCP  Final Clinical Impression(s) / ED Diagnoses Final diagnoses:  Sprain of right knee, unspecified ligament, initial encounter    Rx / DC Orders ED Discharge Orders    None       Darica Goren, Wyvonnia Dusky, MD 10/12/19 1104

## 2019-10-12 NOTE — Progress Notes (Signed)
Orthopedic Tech Progress Note Patient Details:  Latorie Montesano 2000-08-29 [id number]  Ortho Devices Type of Ortho Device: Crutches, Knee Immobilizer Ortho Device/Splint Location: RLE Ortho Device/Splint Interventions: Ordered, Application, Adjustment   Post Interventions Patient Tolerated: Ambulated well, Well Instructions Provided: Poper ambulation with device, Care of device   Donald Pore 10/12/2019, 11:27 AM

## 2019-10-12 NOTE — ED Notes (Signed)
MD at bedside and Ice bag given to place on injured knee.

## 2019-11-03 ENCOUNTER — Emergency Department (HOSPITAL_COMMUNITY)
Admission: EM | Admit: 2019-11-03 | Discharge: 2019-11-03 | Disposition: A | Discharge status: Home / Self Care | Payer: Federal, State, Local not specified - PPO | Attending: Emergency Medicine | Admitting: Emergency Medicine

## 2019-11-03 ENCOUNTER — Encounter (HOSPITAL_COMMUNITY): Payer: Self-pay | Admitting: Emergency Medicine

## 2019-11-03 DIAGNOSIS — Z5321 Procedure and treatment not carried out due to patient leaving prior to being seen by health care provider: Secondary | ICD-10-CM | POA: Insufficient documentation

## 2019-11-03 DIAGNOSIS — N390 Urinary tract infection, site not specified: Secondary | ICD-10-CM | POA: Insufficient documentation

## 2019-11-03 LAB — URINALYSIS, ROUTINE W REFLEX MICROSCOPIC
Bilirubin Urine: NEGATIVE
Glucose, UA: NEGATIVE mg/dL
Ketones, ur: NEGATIVE mg/dL
Nitrite: NEGATIVE
Protein, ur: 100 mg/dL — AB
Specific Gravity, Urine: 1.026 (ref 1.005–1.030)
pH: 5 (ref 5.0–8.0)

## 2019-11-03 LAB — PREGNANCY, URINE: Preg Test, Ur: NEGATIVE

## 2019-11-03 NOTE — ED Triage Notes (Signed)
Pt reports she thinks she has a uti, frequency, burning and discomfort x1 week, denies fevers.

## 2019-11-04 LAB — URINE CULTURE: Culture: <10000 — AB

## 2019-11-08 ENCOUNTER — Emergency Department (HOSPITAL_COMMUNITY): Admission: EM | Admit: 2019-11-08 | Payer: Federal, State, Local not specified - PPO | Source: Home / Self Care

## 2019-11-08 ENCOUNTER — Other Ambulatory Visit: Payer: Self-pay

## 2019-11-09 ENCOUNTER — Other Ambulatory Visit: Payer: Self-pay

## 2019-11-09 ENCOUNTER — Encounter (HOSPITAL_COMMUNITY): Payer: Self-pay | Admitting: *Deleted

## 2019-11-09 ENCOUNTER — Emergency Department (HOSPITAL_COMMUNITY)
Admission: EM | Admit: 2019-11-09 | Discharge: 2019-11-09 | Disposition: A | Discharge status: Home / Self Care | Payer: Federal, State, Local not specified - PPO | Attending: Emergency Medicine | Admitting: Emergency Medicine

## 2019-11-09 DIAGNOSIS — R319 Hematuria, unspecified: Secondary | ICD-10-CM | POA: Insufficient documentation

## 2019-11-09 DIAGNOSIS — N390 Urinary tract infection, site not specified: Secondary | ICD-10-CM | POA: Insufficient documentation

## 2019-11-09 DIAGNOSIS — F17200 Nicotine dependence, unspecified, uncomplicated: Secondary | ICD-10-CM | POA: Insufficient documentation

## 2019-11-09 DIAGNOSIS — R3915 Urgency of urination: Secondary | ICD-10-CM | POA: Diagnosis present

## 2019-11-09 LAB — URINALYSIS, ROUTINE W REFLEX MICROSCOPIC
Glucose, UA: 250 mg/dL — AB
Ketones, ur: NEGATIVE mg/dL
Nitrite: POSITIVE — AB
Protein, ur: >300 mg/dL — AB
Specific Gravity, Urine: >1.03 — ABNORMAL HIGH (ref 1.005–1.030)
pH: 5 (ref 5.0–8.0)

## 2019-11-09 LAB — BASIC METABOLIC PANEL
Anion gap: 6 (ref 5–15)
BUN: 9 mg/dL (ref 6–20)
CO2: 26 mmol/L (ref 22–32)
Calcium: 9.1 mg/dL (ref 8.9–10.3)
Chloride: 105 mmol/L (ref 98–111)
Creatinine, Ser: 0.7 mg/dL (ref 0.44–1.00)
GFR calc Af Amer: >60 mL/min (ref >60)
GFR calc non Af Amer: >60 mL/min (ref >60)
Glucose, Bld: 87 mg/dL (ref 70–99)
Potassium: 3.8 mmol/L (ref 3.5–5.1)
Sodium: 137 mmol/L (ref 135–145)

## 2019-11-09 LAB — URINALYSIS, MICROSCOPIC (REFLEX): RBC / HPF: >50 RBC/hpf (ref 0–5)

## 2019-11-09 LAB — CBG MONITORING, ED
Glucose-Capillary: 69 mg/dL — ABNORMAL LOW (ref 70–99)
Glucose-Capillary: 79 mg/dL (ref 70–99)

## 2019-11-09 LAB — POC URINE PREG, ED: Preg Test, Ur: NEGATIVE

## 2019-11-09 MED ORDER — CEPHALEXIN 250 MG PO CAPS
500.0000 mg | ORAL_CAPSULE | Freq: Once | ORAL | Status: AC
  Administered 2019-11-09: 500 mg via ORAL
  Filled 2019-11-09: qty 2

## 2019-11-09 MED ORDER — CEPHALEXIN 500 MG PO CAPS
500.0000 mg | ORAL_CAPSULE | Freq: Two times a day (BID) | ORAL | 0 refills | Status: AC
Start: 2019-11-09 — End: 2019-11-19

## 2019-11-09 NOTE — ED Provider Notes (Addendum)
[facility] EMERGENCY DEPARTMENT Provider Note   CSN: [id number] Arrival date & time: 11/09/19  0242     History Chief Complaint  Patient presents with  . Urinary Urgency    Shelby Blevins is a 19 y.o. female otherwise healthy presents today for "urinary tract infection". Patient reports 1.5 weeks ago she developed burning with urination, this has been a mild intensity pain that occurs every time she urinates improves when she stops urinating, pain initially did not radiate. She has been treating her symptoms with Azo without relief. She reports that she tried to be seen in the emergency department twice in the last week however had to leave due to long wait times. She reports that over the past 3-4 days her pain has begun to radiate up to her bilateral lower back when she urinates. She reports associated symptom of increased urinary frequency.  She reports that she has developed UTIs many times in the past and this feels very similar, she denies any abnormal features.  She denies fever/chills, abdominal pain, nausea/vomiting, diarrhea, vaginal bleeding/discharge, concern for STI or any additional concerns.  HPI     History reviewed. No pertinent past medical history.  There are no problems to display for this patient.   Past Surgical History:  Procedure Laterality Date  . TONSILLECTOMY       OB History   No obstetric history on file.     No family history on file.  Social History   Tobacco Use  . Smoking status: Current Some Day Smoker  . Smokeless tobacco: Never Used  Substance Use Topics  . Alcohol use: Yes  . Drug use: No    Home Medications Prior to Admission medications   Medication Sig Start Date End Date Taking? Authorizing Provider  Cranberry-Vitamin C (AZO CRANBERRY URINARY TRACT PO) Take 1 tablet by mouth daily as needed (UTI symptoms).   Yes [provider]  melatonin 5 MG TABS Take 5 mg by mouth at bedtime as needed (sleep  aid).   Yes [provider]  acetaminophen (TYLENOL) 325 MG tablet Take 2 tablets (650 mg total) by mouth every 6 (six) hours as needed for mild pain or moderate pain. Patient not taking: Reported on 11/09/2019 08/08/17   Mathews Robinsons B, PA-C  benzonatate (TESSALON) 100 MG capsule Take 1 capsule (100 mg total) by mouth every 8 (eight) hours. Patient not taking: Reported on 11/09/2019 05/13/18   Carlyle Basques P, PA-C  cephALEXin (KEFLEX) 500 MG capsule Take 1 capsule (500 mg total) by mouth 2 (two) times daily for 10 days. 11/09/19 11/19/19  Harlene Salts A, PA-C  diclofenac (VOLTAREN) 50 MG EC tablet Take 1 tablet (50 mg total) 2 (two) times daily by mouth. Take with food Patient not taking: Reported on 03/02/2018 02/19/17   Hayden Rasmussen, NP  ibuprofen (ADVIL,MOTRIN) 600 MG tablet Take 1 tablet (600 mg total) by mouth every 6 (six) hours as needed. Patient not taking: Reported on 03/02/2018 08/08/17   Georgiana Shore, PA-C  nitrofurantoin, macrocrystal-monohydrate, (MACROBID) 100 MG capsule Take 1 capsule (100 mg total) by mouth 2 (two) times daily. Patient not taking: Reported on 11/09/2019 03/02/18   Cristina Gong, PA-C  ondansetron (ZOFRAN) 4 MG tablet Take 1 tablet (4 mg total) by mouth every 6 (six) hours. Patient not taking: Reported on 11/09/2019 05/13/18   Leretha Dykes, PA-C    Allergies    Patient has no known allergies.  Review of Systems   Review  of Systems  Constitutional: Negative.  Negative for chills and fever.  Gastrointestinal: Negative.  Negative for abdominal pain, diarrhea, nausea and vomiting.  Genitourinary: Positive for dysuria, flank pain and urgency. Negative for difficulty urinating, genital sores, pelvic pain, vaginal bleeding and vaginal discharge.    Physical Exam Updated Vital Signs BP 112/69   Pulse 68   Temp 98.5 F (36.9 C) (Oral)   Resp 15   SpO2 100%   Physical Exam Constitutional:      General: She is not in acute  distress.    Appearance: Normal appearance. She is well-developed. She is not ill-appearing or diaphoretic.  HENT:     Head: Normocephalic and atraumatic.  Eyes:     General: Vision grossly intact. Gaze aligned appropriately.     Pupils: Pupils are equal, round, and reactive to light.  Neck:     Trachea: Trachea and phonation normal.  Pulmonary:     Effort: Pulmonary effort is normal. No respiratory distress.  Abdominal:     General: There is no distension.     Palpations: Abdomen is soft.     Tenderness: There is no abdominal tenderness. There is no right CVA tenderness, left CVA tenderness, guarding or rebound.  Genitourinary:    Comments: Refused by patient Musculoskeletal:        General: Normal range of motion.     Cervical back: Normal range of motion.  Skin:    General: Skin is warm and dry.  Neurological:     Mental Status: She is alert.     GCS: GCS eye subscore is 4. GCS verbal subscore is 5. GCS motor subscore is 6.     Comments: Speech is clear and goal oriented, follows commands Major Cranial nerves without deficit, no facial droop Moves extremities without ataxia, coordination intact  Psychiatric:        Behavior: Behavior normal.     ED Results / Procedures / Treatments   Labs (all labs ordered are listed, but only abnormal results are displayed) Labs Reviewed  URINALYSIS, ROUTINE W REFLEX MICROSCOPIC - Abnormal; Notable for the following components:      Result Value   Color, Urine ORANGE (*)    APPearance CLOUDY (*)    Specific Gravity, Urine >1.030 (*)    Glucose, UA 250 (*)    Hgb urine dipstick LARGE (*)    Bilirubin Urine MODERATE (*)    Protein, ur >300 (*)    Nitrite POSITIVE (*)    Leukocytes,Ua MODERATE (*)    All other components within normal limits  URINALYSIS, MICROSCOPIC (REFLEX) - Abnormal; Notable for the following components:   Bacteria, UA FEW (*)    All other components within normal limits  CBG MONITORING, ED - Abnormal; Notable  for the following components:   Glucose-Capillary 69 (*)    All other components within normal limits  URINE CULTURE  BASIC METABOLIC PANEL  POC URINE PREG, ED  CBG MONITORING, ED    EKG None  Radiology No results found.  Procedures Procedures (including critical care time)  Medications Ordered in ED Medications  cephALEXin (KEFLEX) capsule 500 mg (500 mg Oral Given 11/09/19 0940)    ED Course  I have reviewed the triage vital signs and the nursing notes.  Pertinent labs & imaging results that were available during my care of the patient were reviewed by me and considered in my medical decision making (see chart for details).  Clinical Course as of Nov 09 1102  Wed Nov 09, 2019  0921 Prior cultures from February and July reviewed and show <10,000, insignificant growth, no susceptibilities.    [BM]  [id number] Urine now shows glucose, will check cbg and bmp.   [BM]  1023 CBG and BMP wnl   [BM]    Clinical Course User Index [BM] Elizabeth PalauMorelli, Dijuan Sleeth A, PA-C   MDM Rules/Calculators/A&P                          Additional history obtained from: 1. Nursing notes from this visit. 2. Reviewed cultures available through EMR from 2 prior visits.  Patient was seen in February diagnosis of acute cystitis with hematuria.  She was treated with Cipro.  Patient had presented to the ER on July 22 but left without being seen after triage.  I reviewed cultures from both of those encounters they showed insignificant growth less than 10,000 colonies, no susceptibilities were performed. -------------------- 19 year old female well-appearing no acute distress reports dysuria for 1.5 weeks similar to prior UTIs.  UA was obtained in triage shows nitrite positive, leukocytes, bacteria, RBCs, hemoglobin glucose.  Specific gravity high urine color is orange, greater than 300 protein.  Suspect some of this is secondary to patient's use of Azo.  Patient denies history of fever/chills, nausea/vomiting,  abdominal pain or concern for STI.  She does report mild bilateral back pain due to her UTI but denies any colicky pain or unilateral pain concerning for kidney stone.  The UA today appears similar to prior but now has glucose in it, she reports family history of diabetes so will obtain CBG and BMP.  Discussed potential of CT scan or kidney stone with patient however she does not feel that she is a kidney stone today, suspect symptoms are secondary to UTI, low suspicion for kidney stone at this time.  Additionally urine pregnancy test is negative. - BMP within normal limits, no evidence of acute kidney injury, electrolyte derangement, gap or DKA.  CBG is within normal limits at 79. - Work-up is reassuring patient does not appear to be in DKA.  I have given patient referral for PCP and begin treatment with Keflex 500 mg twice daily x10 days for possible Pilo however suspect patient with lower UTI at this time.  She was reevaluated she is talking on the phone with a friend and her significant other is at bedside, she is very well-appearing laughing and in no acute distress she is requesting discharge symptoms possibly.  Patient is nontoxic/nonseptic appearing and appears stable for outpatient treatment.   At this time there does not appear to be any evidence of an acute emergency medical condition and the patient appears stable for discharge with appropriate outpatient follow up. Diagnosis was discussed with patient who verbalizes understanding of care plan and is agreeable to discharge. I have discussed return precautions with patient and significant other who verbalizes understanding. Patient encouraged to follow-up with their PCP. All questions answered.  Patient's case and labs discussed with Dr. Renaye Rakersrifan who agrees with plan to discharge with follow-up.   Note: Portions of this report may have been transcribed using voice recognition software. Every effort was made to ensure accuracy; however,  inadvertent computerized transcription errors may still be present. Final Clinical Impression(s) / ED Diagnoses Final diagnoses:  Urinary tract infection with hematuria, site unspecified    Rx / DC Orders ED Discharge Orders         Ordered    cephALEXin (KEFLEX) 500 MG capsule  2 times daily     Discontinue  Reprint     11/09/19 1028           Elizabeth Palau 11/09/19 1035    Elizabeth Palau 11/09/19 1104    Terald Sleeper, MD 11/09/19 [id number]

## 2019-11-09 NOTE — ED Triage Notes (Signed)
Pt c/o uti, she has urgency, burning, and back pain.

## 2019-11-09 NOTE — Discharge Instructions (Addendum)
At this time there does not appear to be the presence of an emergent medical condition, however there is always the potential for conditions to change. Please read and follow the below instructions.  Please return to the Emergency Department immediately for any new or worsening symptoms or if your symptoms do not improve within 3 days. Please be sure to follow up with your Primary Care Provider within one week regarding your visit today; please call their office to schedule an appointment even if you are feeling better for a follow-up visit. Please take your antibiotic Keflex as prescribed until complete to help with your symptoms.  Please drink enough water to avoid dehydration and get plenty of rest.  Get help right away if: You have very bad back pain. You have very bad pain in your lower belly. You have a fever. You are sick to your stomach (nauseous). You are throwing up. You have any new/concerning or worsening of symptoms   Please read the additional information packets attached to your discharge summary.  Do not take your medicine if  develop an itchy rash, swelling in your mouth or lips, or difficulty breathing; call 911 and seek immediate emergency medical attention if this occurs.  You may review your lab tests and imaging results in their entirety on your MyChart account.  Please discuss all results of fully with your primary care provider and other specialist at your follow-up visit.  Note: Portions of this text may have been transcribed using voice recognition software. Every effort was made to ensure accuracy; however, inadvertent computerized transcription errors may still be present.

## 2019-11-10 LAB — URINE CULTURE

## 2020-02-07 ENCOUNTER — Other Ambulatory Visit: Payer: Self-pay

## 2020-02-07 ENCOUNTER — Emergency Department (HOSPITAL_COMMUNITY)
Admission: EM | Admit: 2020-02-07 | Discharge: 2020-02-07 | Disposition: A | Discharge status: Home / Self Care | Payer: Medicaid Other | Attending: Emergency Medicine | Admitting: Emergency Medicine

## 2020-02-07 ENCOUNTER — Encounter (HOSPITAL_COMMUNITY): Payer: Self-pay

## 2020-02-07 DIAGNOSIS — R112 Nausea with vomiting, unspecified: Secondary | ICD-10-CM | POA: Insufficient documentation

## 2020-02-07 DIAGNOSIS — F17200 Nicotine dependence, unspecified, uncomplicated: Secondary | ICD-10-CM | POA: Diagnosis not present

## 2020-02-07 LAB — COMPREHENSIVE METABOLIC PANEL
ALT: 14 U/L (ref 0–44)
AST: 18 U/L (ref 15–41)
Albumin: 4.1 g/dL (ref 3.5–5.0)
Alkaline Phosphatase: 62 U/L (ref 38–126)
Anion gap: 10 (ref 5–15)
BUN: 10 mg/dL (ref 6–20)
CO2: 24 mmol/L (ref 22–32)
Calcium: 9.3 mg/dL (ref 8.9–10.3)
Chloride: 106 mmol/L (ref 98–111)
Creatinine, Ser: 0.63 mg/dL (ref 0.44–1.00)
GFR, Estimated: >60 mL/min (ref >60)
Glucose, Bld: 90 mg/dL (ref 70–99)
Potassium: 4.2 mmol/L (ref 3.5–5.1)
Sodium: 140 mmol/L (ref 135–145)
Total Bilirubin: 1 mg/dL (ref 0.3–1.2)
Total Protein: 7.4 g/dL (ref 6.5–8.1)

## 2020-02-07 LAB — CBC
HCT: 39.5 % (ref 36.0–46.0)
Hemoglobin: 12.7 g/dL (ref 12.0–15.0)
MCH: 26.9 pg (ref 26.0–34.0)
MCHC: 32.2 g/dL (ref 30.0–36.0)
MCV: 83.7 fL (ref 80.0–100.0)
Platelets: 362 10*3/uL (ref 150–400)
RBC: 4.72 MIL/uL (ref 3.87–5.11)
RDW: 13 % (ref 11.5–15.5)
WBC: 5.5 10*3/uL (ref 4.0–10.5)
nRBC: 0 % (ref 0.0–0.2)

## 2020-02-07 LAB — LIPASE, BLOOD: Lipase: 23 U/L (ref 11–51)

## 2020-02-07 LAB — I-STAT BETA HCG BLOOD, ED (MC, WL, AP ONLY): I-stat hCG, quantitative: <5 m[IU]/mL (ref <5)

## 2020-02-07 MED ORDER — ONDANSETRON HCL 4 MG PO TABS
4.0000 mg | ORAL_TABLET | Freq: Four times a day (QID) | ORAL | 0 refills | Status: DC
Start: 2020-02-07 — End: 2021-01-24

## 2020-02-07 MED ORDER — SODIUM CHLORIDE 0.9 % IV BOLUS
500.0000 mL | Freq: Once | INTRAVENOUS | Status: AC
  Administered 2020-02-07: 500 mL via INTRAVENOUS

## 2020-02-07 MED ORDER — ONDANSETRON HCL 4 MG/2ML IJ SOLN
4.0000 mg | Freq: Once | INTRAMUSCULAR | Status: AC
  Administered 2020-02-07: 4 mg via INTRAVENOUS
  Filled 2020-02-07: qty 2

## 2020-02-07 NOTE — Discharge Instructions (Addendum)
You were given a prescription for zofran to help with your nausea. Please take as directed.  Please follow up with your primary doctor within the next 5-7 days.  If you do not have a primary care provider, information for a healthcare clinic has been provided for you to make arrangements for follow up care. Please return to the ER sooner if you have any new or worsening symptoms, or if you have any of the following symptoms:  Abdominal pain that does not go away.  You have a fever.  You keep throwing up (vomiting).  The pain is felt only in portions of the abdomen. Pain in the right side could possibly be appendicitis. In an adult, pain in the left lower portion of the abdomen could be colitis or diverticulitis.  You pass bloody or black tarry stools.  There is bright red blood in the stool.  The constipation stays for more than 4 days.  There is belly (abdominal) or rectal pain.  You do not seem to be getting better.  You have any questions or concerns.   

## 2020-02-07 NOTE — ED Triage Notes (Signed)
Pt reports heavy drinking last night and thinks she has alcohol poisoning, unable to keep anything down.

## 2020-02-07 NOTE — ED Provider Notes (Addendum)
[facility] EMERGENCY DEPARTMENT Provider Note   CSN: [id number] Arrival date & time: 02/07/20  1826     History Chief Complaint  Patient presents with  . Abdominal Pain  . Emesis    Shelby Blevins is a 19 y.o. female.  HPI   Pt is a 19 y/o female who presents to the ED today for eval of nausea and vomiting. States that she went out drinking yesterday with her friends and drank too much. States she woke up this AM with NV and has not been able to keep anything down. Denies any abd pain but has had some diarrhea. Denies any urinary or vaginal complaints.   Uses marijuana daily. Drinks ETOH 1-2 times per week.   History reviewed. No pertinent past medical history.  There are no problems to display for this patient.   Past Surgical History:  Procedure Laterality Date  . TONSILLECTOMY       OB History   No obstetric history on file.     No family history on file.  Social History   Tobacco Use  . Smoking status: Current Some Day Smoker  . Smokeless tobacco: Never Used  Substance Use Topics  . Alcohol use: Yes  . Drug use: No    Home Medications Prior to Admission medications   Medication Sig Start Date End Date Taking? Authorizing Provider  acetaminophen (TYLENOL) 325 MG tablet Take 2 tablets (650 mg total) by mouth every 6 (six) hours as needed for mild pain or moderate pain. Patient not taking: Reported on 11/09/2019 08/08/17   Mathews Robinsons B, PA-C  benzonatate (TESSALON) 100 MG capsule Take 1 capsule (100 mg total) by mouth every 8 (eight) hours. Patient not taking: Reported on 11/09/2019 05/13/18   Carlyle Basques P, PA-C  Cranberry-Vitamin C (AZO CRANBERRY URINARY TRACT PO) Take 1 tablet by mouth daily as needed (UTI symptoms).    [provider]  diclofenac (VOLTAREN) 50 MG EC tablet Take 1 tablet (50 mg total) 2 (two) times daily by mouth. Take with food Patient not taking: Reported on 03/02/2018 02/19/17   Hayden Rasmussen, NP    ibuprofen (ADVIL,MOTRIN) 600 MG tablet Take 1 tablet (600 mg total) by mouth every 6 (six) hours as needed. Patient not taking: Reported on 03/02/2018 08/08/17   Mathews Robinsons B, PA-C  melatonin 5 MG TABS Take 5 mg by mouth at bedtime as needed (sleep aid).    [provider]  nitrofurantoin, macrocrystal-monohydrate, (MACROBID) 100 MG capsule Take 1 capsule (100 mg total) by mouth 2 (two) times daily. Patient not taking: Reported on 11/09/2019 03/02/18   Cristina Gong, PA-C  ondansetron (ZOFRAN) 4 MG tablet Take 1 tablet (4 mg total) by mouth every 6 (six) hours. Patient not taking: Reported on 11/09/2019 05/13/18   Leretha Dykes, PA-C    Allergies    Patient has no known allergies.  Review of Systems   Review of Systems  Constitutional: Negative for fever.  HENT: Negative for ear pain and sore throat.   Eyes: Negative for visual disturbance.  Respiratory: Negative for cough and shortness of breath.   Cardiovascular: Negative for chest pain.  Gastrointestinal: Positive for diarrhea, nausea and vomiting. Negative for abdominal pain.  Genitourinary: Negative for dysuria and hematuria.  Musculoskeletal: Negative for back pain.  Skin: Negative for rash.  Neurological: Negative for headaches.  All other systems reviewed and are negative.   Physical Exam Updated Vital Signs BP 103/74 (BP Location: Right Arm)  Pulse 74   Temp 98.2 F (36.8 C) (Oral)   Resp 19   Ht 5\' 6"  (1.676 m)   Wt 90.7 kg   SpO2 100%   BMI 32.28 kg/m   Physical Exam Vitals and nursing note reviewed.  Constitutional:      General: She is not in acute distress.    Appearance: She is well-developed.  HENT:     Head: Normocephalic and atraumatic.  Eyes:     Conjunctiva/sclera: Conjunctivae normal.  Cardiovascular:     Rate and Rhythm: Normal rate and regular rhythm.     Heart sounds: Normal heart sounds. No murmur heard.   Pulmonary:     Effort: Pulmonary effort is normal. No  respiratory distress.     Breath sounds: Normal breath sounds.  Abdominal:     General: Bowel sounds are normal.     Palpations: Abdomen is soft.     Tenderness: There is no abdominal tenderness. There is no guarding or rebound.  Musculoskeletal:     Cervical back: Neck supple.  Skin:    General: Skin is warm and dry.  Neurological:     Mental Status: She is alert.     ED Results / Procedures / Treatments   Labs (all labs ordered are listed, but only abnormal results are displayed) Labs Reviewed  LIPASE, BLOOD  COMPREHENSIVE METABOLIC PANEL  CBC  I-STAT BETA HCG BLOOD, ED ([facility], [facility], [facility] ONLY)    EKG None  Radiology No results found.  Procedures Procedures (including critical care time)  Medications Ordered in ED Medications  sodium chloride 0.9 % bolus 500 mL (500 mLs Intravenous New Bag/Given 02/07/20 1923)  ondansetron (ZOFRAN) injection 4 mg (4 mg Intravenous Given 02/07/20 1924)    ED Course  I have reviewed the triage vital signs and the nursing notes.  Pertinent labs & imaging results that were available during my care of the patient were reviewed by me and considered in my medical decision making (see chart for details).    MDM Rules/Calculators/A&P                          19 year old female presenting to the emergency department today for evaluation of nausea and vomiting after a night of heavy drinking last night.  Suspect patient's symptoms are related to her alcohol use.  Her abdomen is soft and nontender on my evaluation.  No leukocytosis, normal electrolytes, normal kidney and liver function, pregnancy test negative.  Tolerating p.o.'s in the ED and given IV fluids and Zofran.  Feeling well on reassessment.  Will give Zofran for home.  Advise hydration, PCP follow-up and strict return precautions.  She voices understanding of the plan and reasons to return.  Questions answered.  Patient stable for discharge.   Final Clinical Impression(s) / ED  Diagnoses Final diagnoses:  Non-intractable vomiting with nausea, unspecified vomiting type    Rx / DC Orders ED Discharge Orders    None       36, PA-C 02/07/20 2032    2033, PA-C 02/07/20 2033    2034, MD 02/08/20 1228

## 2020-02-07 NOTE — ED Notes (Signed)
Pt tolerating fluid challenge, reports still feeling "queasy" but has not vomited after zofran administration, vss on monitor, pt resting comfortably in stretcher. Side rails up, call bell in reach.

## 2020-07-16 ENCOUNTER — Encounter (HOSPITAL_COMMUNITY): Payer: Self-pay | Admitting: Emergency Medicine

## 2020-07-16 ENCOUNTER — Other Ambulatory Visit: Payer: Self-pay

## 2020-07-16 ENCOUNTER — Emergency Department (HOSPITAL_COMMUNITY)
Admission: EM | Admit: 2020-07-16 | Discharge: 2020-07-16 | Disposition: A | Discharge status: Home / Self Care | Payer: BC Managed Care – PPO | Attending: Emergency Medicine | Admitting: Emergency Medicine

## 2020-07-16 DIAGNOSIS — J069 Acute upper respiratory infection, unspecified: Secondary | ICD-10-CM | POA: Diagnosis not present

## 2020-07-16 DIAGNOSIS — H748X3 Other specified disorders of middle ear and mastoid, bilateral: Secondary | ICD-10-CM | POA: Insufficient documentation

## 2020-07-16 DIAGNOSIS — R059 Cough, unspecified: Secondary | ICD-10-CM | POA: Diagnosis present

## 2020-07-16 DIAGNOSIS — F17200 Nicotine dependence, unspecified, uncomplicated: Secondary | ICD-10-CM | POA: Insufficient documentation

## 2020-07-16 DIAGNOSIS — Z20822 Contact with and (suspected) exposure to covid-19: Secondary | ICD-10-CM | POA: Insufficient documentation

## 2020-07-16 NOTE — ED Triage Notes (Addendum)
Patient reports dry cough, runny nose, chills and congestion x2 weeks. Reports she is vaccinated against Covid. She states she has been taking zyrtec, benadryl and mucinex w/ no relief. At home covid test was negative (taken yesterday)

## 2020-07-16 NOTE — Discharge Instructions (Signed)
Please return for any problem.  °

## 2020-07-16 NOTE — ED Provider Notes (Signed)
[facility]-EMERGENCY DEPT Provider Note   CSN: [id number] Arrival date & time: 07/16/20  1610     History Chief Complaint  Patient presents with  . Cough    Shelby Blevins is a 20 y.o. female.  20 year old female with prior medical history detailed below presents for evaluation.  Patient reports proximally 1 week of upper respiratory congestion.  She complains of bilateral earache, runny nose, mild cough.  She denies fever.  She reports a note at home Covid test yesterday was negative.  She denies Covid exposure.  She is status post COVID vaccination.  She does report a longstanding history of seasonal allergies.  Patient takes Zyrtec for these seasonal allergies.  Symptoms today are consistent with likely seasonal allergies -however they are slightly worse per report.  The history is provided by the patient and medical records.  Cough Cough characteristics:  Dry Sputum characteristics:  Nondescript Severity:  Mild Onset quality:  Gradual Duration:  1 week Timing:  Constant Progression:  Worsening Chronicity:  New Smoker: no   Relieved by:  Nothing Worsened by:  Nothing Ineffective treatments:  Decongestant      History reviewed. No pertinent past medical history.  There are no problems to display for this patient.   Past Surgical History:  Procedure Laterality Date  . TONSILLECTOMY       OB History   No obstetric history on file.     History reviewed. No pertinent family history.  Social History   Tobacco Use  . Smoking status: Current Some Day Smoker  . Smokeless tobacco: Never Used  Substance Use Topics  . Alcohol use: Yes  . Drug use: No    Home Medications Prior to Admission medications   Medication Sig Start Date End Date Taking? Authorizing Provider  acetaminophen (TYLENOL) 325 MG tablet Take 2 tablets (650 mg total) by mouth every 6 (six) hours as needed for mild pain or moderate pain. Patient not taking: Reported on  11/09/2019 08/08/17   Mathews Robinsons B, PA-C  benzonatate (TESSALON) 100 MG capsule Take 1 capsule (100 mg total) by mouth every 8 (eight) hours. Patient not taking: Reported on 11/09/2019 05/13/18   Carlyle Basques P, PA-C  Cranberry-Vitamin C (AZO CRANBERRY URINARY TRACT PO) Take 1 tablet by mouth daily as needed (UTI symptoms).    [provider]  diclofenac (VOLTAREN) 50 MG EC tablet Take 1 tablet (50 mg total) 2 (two) times daily by mouth. Take with food Patient not taking: Reported on 03/02/2018 02/19/17   Hayden Rasmussen, NP  ibuprofen (ADVIL,MOTRIN) 600 MG tablet Take 1 tablet (600 mg total) by mouth every 6 (six) hours as needed. Patient not taking: Reported on 03/02/2018 08/08/17   Mathews Robinsons B, PA-C  melatonin 5 MG TABS Take 5 mg by mouth at bedtime as needed (sleep aid).    [provider]  nitrofurantoin, macrocrystal-monohydrate, (MACROBID) 100 MG capsule Take 1 capsule (100 mg total) by mouth 2 (two) times daily. Patient not taking: Reported on 11/09/2019 03/02/18   Cristina Gong, PA-C  ondansetron (ZOFRAN) 4 MG tablet Take 1 tablet (4 mg total) by mouth every 6 (six) hours. 02/07/20   Couture, Cortni S, PA-C    Allergies    Patient has no known allergies.  Review of Systems   Review of Systems  Respiratory: Positive for cough.   All other systems reviewed and are negative.   Physical Exam Updated Vital Signs BP (!) 128/94   Pulse 91   Temp  98.2 F (36.8 C)   Resp 16   SpO2 93%   Physical Exam Vitals and nursing note reviewed.  Constitutional:      General: She is not in acute distress.    Appearance: She is well-developed.  HENT:     Head: Normocephalic and atraumatic.     Right Ear: Ear canal and external ear normal.     Left Ear: Ear canal and external ear normal.     Ears:     Comments: Mild bilateral effusion behind both TMs.  No evidence of infection.    Nose: Congestion present.     Mouth/Throat:     Mouth: Mucous membranes are  moist.     Pharynx: No oropharyngeal exudate or posterior oropharyngeal erythema.  Eyes:     Extraocular Movements: Extraocular movements intact.     Conjunctiva/sclera: Conjunctivae normal.     Pupils: Pupils are equal, round, and reactive to light.  Cardiovascular:     Rate and Rhythm: Normal rate and regular rhythm.     Heart sounds: Normal heart sounds.  Pulmonary:     Effort: Pulmonary effort is normal. No respiratory distress.     Breath sounds: Normal breath sounds.  Abdominal:     General: There is no distension.     Palpations: Abdomen is soft.     Tenderness: There is no abdominal tenderness.  Musculoskeletal:        General: No deformity. Normal range of motion.     Cervical back: Normal range of motion and neck supple.  Skin:    General: Skin is warm and dry.  Neurological:     General: No focal deficit present.     Mental Status: She is alert and oriented to person, place, and time. Mental status is at baseline.     ED Results / Procedures / Treatments   Labs (all labs ordered are listed, but only abnormal results are displayed) Labs Reviewed  SARS CORONAVIRUS 2 (TAT 6-24 HRS)    EKG None  Radiology No results found.  Procedures Procedures   Medications Ordered in ED Medications - No data to display  ED Course  I have reviewed the triage vital signs and the nursing notes.  Pertinent labs & imaging results that were available during my care of the patient were reviewed by me and considered in my medical decision making (see chart for details).    MDM Rules/Calculators/A&P                          MDM  MSE complete  Shelby Blevins was evaluated in Emergency Department on 07/16/2020 for the symptoms described in the history of present illness. She was evaluated in the context of the global COVID-19 pandemic, which necessitated consideration that the patient might be at risk for infection with the SARS-CoV-2 virus that causes COVID-19. Institutional  protocols and algorithms that pertain to the evaluation of patients at risk for COVID-19 are in a state of rapid change based on information released by regulatory bodies including the CDC and federal and state organizations. These policies and algorithms were followed during the patient's care in the ED.  Patient is presenting with complaint of upper respiratory congestion.  Symptoms are consistent with possible mild URI versus seasonal allergies.  Requested Covid test.  She understands and agrees with plan to follow-up the results through MyChart system.  Importance of close follow-up is stressed.  Strict return precautions given and understood.  Final Clinical Impression(s) / ED Diagnoses Final diagnoses:  Upper respiratory tract infection, unspecified type    Rx / DC Orders ED Discharge Orders    None       Wynetta Fines, MD 07/16/20 1712

## 2020-07-17 LAB — SARS CORONAVIRUS 2 (TAT 6-24 HRS): SARS Coronavirus 2: NEGATIVE

## 2020-10-07 ENCOUNTER — Encounter (HOSPITAL_COMMUNITY): Payer: Self-pay

## 2020-10-07 ENCOUNTER — Emergency Department (HOSPITAL_COMMUNITY): Payer: BC Managed Care – PPO

## 2020-10-07 ENCOUNTER — Emergency Department (HOSPITAL_COMMUNITY)
Admission: EM | Admit: 2020-10-07 | Discharge: 2020-10-07 | Disposition: A | Discharge status: Home / Self Care | Payer: BC Managed Care – PPO | Attending: Emergency Medicine | Admitting: Emergency Medicine

## 2020-10-07 ENCOUNTER — Other Ambulatory Visit: Payer: Self-pay

## 2020-10-07 DIAGNOSIS — F17200 Nicotine dependence, unspecified, uncomplicated: Secondary | ICD-10-CM | POA: Insufficient documentation

## 2020-10-07 DIAGNOSIS — Y990 Civilian activity done for income or pay: Secondary | ICD-10-CM | POA: Insufficient documentation

## 2020-10-07 DIAGNOSIS — W1840XA Slipping, tripping and stumbling without falling, unspecified, initial encounter: Secondary | ICD-10-CM | POA: Insufficient documentation

## 2020-10-07 DIAGNOSIS — M25561 Pain in right knee: Secondary | ICD-10-CM | POA: Insufficient documentation

## 2020-10-07 NOTE — ED Provider Notes (Signed)
[facility] EMERGENCY DEPARTMENT Provider Note   CSN: [id number] Arrival date & time: 10/07/20  0848     History No chief complaint on file.   Shelby Blevins is a 20 y.o. female.  HPI 20 year old female with no significant medical his presents to the ER with complaints of right knee pain.  States she slipped at work and felt like she dislocated her knee.  She states this has happened in the past.  She says normally she would put a sling on and take Tylenol and use ice, and her pain improves.  However this morning spoke up with some joint stiffness and states her new right knee "locked".  She denies any numbness or tingling.    History reviewed. No pertinent past medical history.  There are no problems to display for this patient.   Past Surgical History:  Procedure Laterality Date   TONSILLECTOMY       OB History   No obstetric history on file.     No family history on file.  Social History   Tobacco Use   Smoking status: Some Days    Pack years: 0.00   Smokeless tobacco: Never  Substance Use Topics   Alcohol use: Yes   Drug use: No    Home Medications Prior to Admission medications   Medication Sig Start Date End Date Taking? Authorizing Provider  acetaminophen (TYLENOL) 325 MG tablet Take 2 tablets (650 mg total) by mouth every 6 (six) hours as needed for mild pain or moderate pain. Patient not taking: Reported on 11/09/2019 08/08/17   Mathews Robinsons B, PA-C  benzonatate (TESSALON) 100 MG capsule Take 1 capsule (100 mg total) by mouth every 8 (eight) hours. Patient not taking: Reported on 11/09/2019 05/13/18   Carlyle Basques P, PA-C  Cranberry-Vitamin C (AZO CRANBERRY URINARY TRACT PO) Take 1 tablet by mouth daily as needed (UTI symptoms).    [provider]  diclofenac (VOLTAREN) 50 MG EC tablet Take 1 tablet (50 mg total) 2 (two) times daily by mouth. Take with food Patient not taking: Reported on 03/02/2018 02/19/17   Hayden Rasmussen, NP   ibuprofen (ADVIL,MOTRIN) 600 MG tablet Take 1 tablet (600 mg total) by mouth every 6 (six) hours as needed. Patient not taking: Reported on 03/02/2018 08/08/17   Mathews Robinsons B, PA-C  melatonin 5 MG TABS Take 5 mg by mouth at bedtime as needed (sleep aid).    [provider]  nitrofurantoin, macrocrystal-monohydrate, (MACROBID) 100 MG capsule Take 1 capsule (100 mg total) by mouth 2 (two) times daily. Patient not taking: Reported on 11/09/2019 03/02/18   Cristina Gong, PA-C  ondansetron (ZOFRAN) 4 MG tablet Take 1 tablet (4 mg total) by mouth every 6 (six) hours. 02/07/20   Couture, Cortni S, PA-C    Allergies    Patient has no known allergies.  Review of Systems   Review of Systems  Musculoskeletal:  Positive for arthralgias.  Neurological:  Negative for numbness.   Physical Exam Updated Vital Signs BP 108/82 (BP Location: Right Arm)   Pulse 60   Temp 97.7 F (36.5 C) (Oral)   Resp 12   SpO2 100%   Physical Exam Vitals and nursing note reviewed.  Constitutional:      General: She is not in acute distress.    Appearance: She is well-developed.  HENT:     Head: Normocephalic and atraumatic.  Eyes:     Conjunctiva/sclera: Conjunctivae normal.  Cardiovascular:     Rate  and Rhythm: Normal rate and regular rhythm.     Heart sounds: No murmur heard. Pulmonary:     Effort: Pulmonary effort is normal. No respiratory distress.     Breath sounds: Normal breath sounds.  Abdominal:     Palpations: Abdomen is soft.     Tenderness: There is no abdominal tenderness.  Musculoskeletal:     Cervical back: Neck supple.     Comments: Right knee with no significant effusion, some generalized tenderness.  No bruising, no overlying erythema, warmth.  She does have some pain with range of motion, but range of motion is preserved.  2+ DP pulses.  No tibial plateau tenderness.  Skin:    General: Skin is warm and dry.  Neurological:     General: No focal deficit present.      Mental Status: She is alert and oriented to person, place, and time.     Motor: No weakness.    ED Results / Procedures / Treatments   Labs (all labs ordered are listed, but only abnormal results are displayed) Labs Reviewed - No data to display  EKG None  Radiology DG Knee Complete 4 Views Right  Result Date: 10/07/2020 CLINICAL DATA:  Right knee pain after fall at work EXAM: RIGHT KNEE - COMPLETE 4+ VIEW COMPARISON:  None. FINDINGS: No evidence of fracture, dislocation, or joint effusion. No evidence of arthropathy or other focal bone abnormality. Soft tissues are unremarkable. IMPRESSION: Negative. Electronically Signed   By: Marnee Spring M.D.   On: 10/07/2020 09:28    Procedures Procedures   Medications Ordered in ED Medications - No data to display  ED Course  I have reviewed the triage vital signs and the nursing notes.  Pertinent labs & imaging results that were available during my care of the patient were reviewed by me and considered in my medical decision making (see chart for details).    MDM Rules/Calculators/A&P                          20 year old female with right knee pain.  Low suspicion for septic joint, gout.  Plain films without any evidence of fractures or dislocations.  Patient has a knee sleeve at home.  Encouraged continuing supportive care, icing, anti-inflammatories.  Will refer to orthopedics if her pain continues.  We discussed return precautions.  She voiced understanding and is agreeable.  Stable for discharge. Final Clinical Impression(s) / ED Diagnoses Final diagnoses:  Acute pain of right knee    Rx / DC Orders ED Discharge Orders     None        Leone Brand 10/07/20 1119    Tegeler, Canary Brim, MD 10/07/20 1538

## 2020-10-07 NOTE — Discharge Instructions (Addendum)
Please continue to take Tylenol/ibuprofen for pain.  Follow-up with orthopedics if your symptoms continue.  Return to the ER for any new or worsening symptoms.

## 2020-10-07 NOTE — ED Notes (Signed)
Ortho paged for knee sleeve

## 2020-10-07 NOTE — ED Triage Notes (Signed)
Patient complains of right knee pain after slipping at work yesterday and stating that she thinks it was out of place. Now complains of pain with any ambulation. Iced and ibuprofen.

## 2021-01-24 ENCOUNTER — Emergency Department (HOSPITAL_COMMUNITY): Payer: BC Managed Care – PPO

## 2021-01-24 ENCOUNTER — Encounter (HOSPITAL_COMMUNITY): Payer: Self-pay | Admitting: Pharmacy Technician

## 2021-01-24 ENCOUNTER — Emergency Department (HOSPITAL_COMMUNITY)
Admission: EM | Admit: 2021-01-24 | Discharge: 2021-01-24 | Disposition: A | Discharge status: Home / Self Care | Payer: BC Managed Care – PPO | Attending: Emergency Medicine | Admitting: Emergency Medicine

## 2021-01-24 ENCOUNTER — Other Ambulatory Visit: Payer: Self-pay

## 2021-01-24 DIAGNOSIS — F17200 Nicotine dependence, unspecified, uncomplicated: Secondary | ICD-10-CM | POA: Insufficient documentation

## 2021-01-24 DIAGNOSIS — J029 Acute pharyngitis, unspecified: Secondary | ICD-10-CM

## 2021-01-24 DIAGNOSIS — R509 Fever, unspecified: Secondary | ICD-10-CM | POA: Insufficient documentation

## 2021-01-24 DIAGNOSIS — N9489 Other specified conditions associated with female genital organs and menstrual cycle: Secondary | ICD-10-CM | POA: Diagnosis not present

## 2021-01-24 DIAGNOSIS — Z20822 Contact with and (suspected) exposure to covid-19: Secondary | ICD-10-CM | POA: Diagnosis not present

## 2021-01-24 LAB — I-STAT CHEM 8, ED
BUN: 11 mg/dL (ref 6–20)
Calcium, Ion: 1.12 mmol/L — ABNORMAL LOW (ref 1.15–1.40)
Chloride: 104 mmol/L (ref 98–111)
Creatinine, Ser: 0.6 mg/dL (ref 0.44–1.00)
Glucose, Bld: 95 mg/dL (ref 70–99)
HCT: 42 % (ref 36.0–46.0)
Hemoglobin: 14.3 g/dL (ref 12.0–15.0)
Potassium: 3.7 mmol/L (ref 3.5–5.1)
Sodium: 138 mmol/L (ref 135–145)
TCO2: 25 mmol/L (ref 22–32)

## 2021-01-24 LAB — CBC WITH DIFFERENTIAL/PLATELET
Abs Immature Granulocytes: 0.04 10*3/uL (ref 0.00–0.07)
Basophils Absolute: 0 10*3/uL (ref 0.0–0.1)
Basophils Relative: 0 %
Eosinophils Absolute: 0 10*3/uL (ref 0.0–0.5)
Eosinophils Relative: 0 %
HCT: 39.7 % (ref 36.0–46.0)
Hemoglobin: 12.7 g/dL (ref 12.0–15.0)
Immature Granulocytes: 0 %
Lymphocytes Relative: 23 %
Lymphs Abs: 2.5 10*3/uL (ref 0.7–4.0)
MCH: 26.7 pg (ref 26.0–34.0)
MCHC: 32 g/dL (ref 30.0–36.0)
MCV: 83.6 fL (ref 80.0–100.0)
Monocytes Absolute: 0.9 10*3/uL (ref 0.1–1.0)
Monocytes Relative: 8 %
Neutro Abs: 7.5 10*3/uL (ref 1.7–7.7)
Neutrophils Relative %: 69 %
Platelets: 330 10*3/uL (ref 150–400)
RBC: 4.75 MIL/uL (ref 3.87–5.11)
RDW: 13.3 % (ref 11.5–15.5)
WBC: 11 10*3/uL — ABNORMAL HIGH (ref 4.0–10.5)
nRBC: 0 % (ref 0.0–0.2)

## 2021-01-24 LAB — I-STAT BETA HCG BLOOD, ED (MC, WL, AP ONLY): I-stat hCG, quantitative: <5 m[IU]/mL (ref <5)

## 2021-01-24 LAB — RESP PANEL BY RT-PCR (FLU A&B, COVID) ARPGX2
Influenza A by PCR: NEGATIVE
Influenza B by PCR: NEGATIVE
SARS Coronavirus 2 by RT PCR: NEGATIVE

## 2021-01-24 LAB — GROUP A STREP BY PCR: Group A Strep by PCR: NOT DETECTED

## 2021-01-24 MED ORDER — CLINDAMYCIN HCL 300 MG PO CAPS: 300.0000 mg | ORAL_CAPSULE | Freq: Three times a day (TID) | ORAL | 0 refills | Status: AC

## 2021-01-24 MED ORDER — NAPROXEN 500 MG PO TABS: 500.0000 mg | ORAL_TABLET | Freq: Two times a day (BID) | ORAL | 0 refills | Status: DC

## 2021-01-24 MED ORDER — LIDOCAINE VISCOUS HCL 2 % MT SOLN: 15.0000 mL | OROMUCOSAL | 0 refills | Status: DC | PRN

## 2021-01-24 MED ORDER — CLINDAMYCIN HCL 150 MG PO CAPS
300.0000 mg | ORAL_CAPSULE | Freq: Once | ORAL | Status: AC
  Administered 2021-01-24: 300 mg via ORAL
  Filled 2021-01-24: qty 2

## 2021-01-24 MED ORDER — LIDOCAINE VISCOUS HCL 2 % MT SOLN
15.0000 mL | Freq: Once | OROMUCOSAL | Status: AC
  Administered 2021-01-24: 15 mL via OROMUCOSAL
  Filled 2021-01-24: qty 15

## 2021-01-24 MED ORDER — IOHEXOL 300 MG/ML  SOLN
75.0000 mL | Freq: Once | INTRAMUSCULAR | Status: AC | PRN
  Administered 2021-01-24: 75 mL via INTRAVENOUS

## 2021-01-24 MED ORDER — KETOROLAC TROMETHAMINE 30 MG/ML IJ SOLN
30.0000 mg | Freq: Once | INTRAMUSCULAR | Status: AC
  Administered 2021-01-24: 30 mg via INTRAVENOUS
  Filled 2021-01-24: qty 1

## 2021-01-24 MED ORDER — DEXAMETHASONE 4 MG PO TABS
6.0000 mg | ORAL_TABLET | Freq: Once | ORAL | Status: AC
  Administered 2021-01-24: 6 mg via ORAL
  Filled 2021-01-24: qty 2

## 2021-01-24 NOTE — ED Provider Notes (Signed)
[facility] EMERGENCY DEPARTMENT Provider Note   CSN: [id number] Arrival date & time: 01/24/21  0930     History Chief Complaint  Patient presents with   Sore Throat   Fever    Shelby Blevins is a 20 y.o. female with prior history of tonsillectomy who presents for evaluation of sore throat and fever.  Shelby Blevins states Shelby Blevins has significant pain with swallowing or was able to tolerate her secretions.  Shelby Blevins feels like Shelby Blevins cannot talk due to the pain.  Subjective fever at home however is not taken her temp. no headache, lightness, dizziness, chest pain, shortness of breath, abdominal pain or diarrhea.  No neck stiffness or neck rigidity.  Rates pain a 8/10.  Denies additional aggravating or alleviating factors. No allergic sx> No known urticaria, allergic contacts, pruritus new lotions, perfumes, detergents. No concern for intraoral STD.  History obtained from patient and past medical records.  No interpreter used  HPI     History reviewed. No pertinent past medical history.  There are no problems to display for this patient.   Past Surgical History:  Procedure Laterality Date   TONSILLECTOMY       OB History   No obstetric history on file.     No family history on file.  Social History   Tobacco Use   Smoking status: Some Days   Smokeless tobacco: Never  Substance Use Topics   Alcohol use: Yes   Drug use: No    Home Medications Prior to Admission medications   Medication Sig Start Date End Date Taking? Authorizing Provider  clindamycin (CLEOCIN) 300 MG capsule Take 1 capsule (300 mg total) by mouth 3 (three) times daily for 7 days. 01/24/21 01/31/21 Yes Marleni Gallardo A, PA-C  lidocaine (XYLOCAINE) 2 % solution Use as directed 15 mLs in the mouth or throat as needed for mouth pain. 01/24/21  Yes Dilara Navarrete A, PA-C  melatonin 5 MG TABS Take 5 mg by mouth at bedtime as needed (sleep aid).   Yes [provider]  Multiple Vitamin  (MULTIVITAMIN) tablet Take 1 tablet by mouth daily.   Yes [provider]  naproxen (NAPROSYN) 500 MG tablet Take 1 tablet (500 mg total) by mouth 2 (two) times daily. 01/24/21  Yes Jonasia Coiner A, PA-C  Probiotic Product (PROBIOTIC PO) Take 1 capsule by mouth daily.   Yes [provider]    Allergies    Patient has no known allergies.  Review of Systems   Review of Systems  Constitutional:  Positive for fever (sub fever).  HENT:  Positive for sore throat. Negative for congestion, drooling, ear discharge, ear pain, facial swelling, nosebleeds, postnasal drip, rhinorrhea, sinus pressure, sinus pain and trouble swallowing.   Respiratory:  Positive for cough. Negative for apnea, choking, chest tightness, shortness of breath, wheezing and stridor.   Cardiovascular: Negative.   Gastrointestinal: Negative.   Genitourinary: Negative.   Musculoskeletal: Negative.   Skin: Negative.   Neurological: Negative.   All other systems reviewed and are negative.  Physical Exam Updated Vital Signs BP (!) 119/95   Pulse 90   Temp 98.4 F (36.9 C) (Oral)   Resp 17   SpO2 96%   Physical Exam Vitals and nursing note reviewed.  Constitutional:      General: Shelby Blevins is not in acute distress.    Appearance: Shelby Blevins is well-developed. Shelby Blevins is not ill-appearing, toxic-appearing or diaphoretic.  HENT:     Head: Atraumatic.     Left Ear:  Tympanic membrane and ear canal normal.     Ears:     Comments: Erythema right TM with mild bulge.    Mouth/Throat:     Tonsils: No tonsillar exudate or tonsillar abscesses. 0 on the right. 0 on the left.     Comments: Posterior oropharynx erythematous however no evidence of tonsillar edema or enlargement.  Uvula midline.  No deviation.  No stridorous breath sounds. Eyes:     Pupils: Pupils are equal, round, and reactive to light.  Cardiovascular:     Rate and Rhythm: Normal rate.     Heart sounds: Normal heart sounds.  Pulmonary:     Effort:  Pulmonary effort is normal. No respiratory distress.     Breath sounds: Normal breath sounds. No stridor. No wheezing, rhonchi or rales.  Abdominal:     General: Bowel sounds are normal. There is no distension.     Palpations: Abdomen is soft.  Musculoskeletal:        General: Normal range of motion.     Cervical back: Normal range of motion and neck supple.  Lymphadenopathy:     Cervical: No cervical adenopathy.  Skin:    General: Skin is warm and dry.     Capillary Refill: Capillary refill takes less than 2 seconds.  Neurological:     General: No focal deficit present.     Mental Status: Shelby Blevins is alert.  Psychiatric:        Mood and Affect: Mood normal.    ED Results / Procedures / Treatments   Labs (all labs ordered are listed, but only abnormal results are displayed) Labs Reviewed  CBC WITH DIFFERENTIAL/PLATELET - Abnormal; Notable for the following components:      Result Value   WBC 11.0 (*)    All other components within normal limits  I-STAT CHEM 8, ED - Abnormal; Notable for the following components:   Calcium, Ion 1.12 (*)    All other components within normal limits  GROUP A STREP BY PCR  RESP PANEL BY RT-PCR (FLU A&B, COVID) ARPGX2  I-STAT BETA HCG BLOOD, ED ([facility], [facility], [facility] ONLY)    EKG None  Radiology CT Soft Tissue Neck W Contrast  Result Date: 01/24/2021 CLINICAL DATA:  Sore throat.  Rule out tonsillitis.  Fever. EXAM: CT NECK WITH CONTRAST TECHNIQUE: Multidetector CT imaging of the neck was performed using the standard protocol following the bolus administration of intravenous contrast. CONTRAST:  65mL OMNIPAQUE IOHEXOL 300 MG/ML  SOLN COMPARISON:  None. FINDINGS: Pharynx and larynx: Soft tissue swelling involving the tonsils bilaterally. No peritonsillar abscess. There is also swelling of the uvula. Normal larynx. Negative for epiglottitis. Salivary glands: No inflammation, mass, or stone. Thyroid: Negative Lymph nodes: Bilateral cervical adenopathy.  Retropharyngeal lymph node on the right 8 mm. Right level 2 lymph nodes 9 mm and 12 mm. Small posterior nodes on the right Left retropharyngeal lymph node 10 mm in diameter. Left level 2 lymph nodes 10 mm and 10 mm. Subcentimeter posterior lymph nodes on the left. Vascular: Normal vascular enhancement. Limited intracranial: Negative Visualized orbits: Negative Mastoids and visualized paranasal sinuses: Negative Skeleton: Negative Upper chest: Lung apices clear bilaterally Other: None IMPRESSION: Findings compatible with pharyngitis with diffuse soft tissue swelling of the tonsils. No abscess. Bilateral cervical adenopathy most compatible with pharyngitis. Electronically Signed   By: Marlan Palau M.D.   On: 01/24/2021 14:19    Procedures Procedures   Medications Ordered in ED Medications  ketorolac (TORADOL) 30 MG/ML injection 30  mg (has no administration in time range)  clindamycin (CLEOCIN) capsule 300 mg (has no administration in time range)  lidocaine (XYLOCAINE) 2 % viscous mouth solution 15 mL (15 mLs Mouth/Throat Given 01/24/21 1245)  dexamethasone (DECADRON) tablet 6 mg (6 mg Oral Given 01/24/21 1245)  iohexol (OMNIPAQUE) 300 MG/ML solution 75 mL (75 mLs Intravenous Contrast Given 01/24/21 1408)   ED Course  I have reviewed the triage vital signs and the nursing notes.  Pertinent labs & imaging results that were available during my care of the patient were reviewed by me and considered in my medical decision making (see chart for details).  20 year old here for evaluation of fever and sore throat.  Has difficulty speaking due to pain.  Afebrile here.  Shelby Blevins is tolerating her secretions.  No stridor.  No neck stiffness or neck rigidity.  No meningismus.  Posterior oropharynx is erythematous however uvula midline.  No evidence of tonsillar edema or exudate.  Does have some mild erythema to right TM.  Heart and lungs clear. Abd soft, nontender.  No traumatic injury  Work-up reviewed and  interpreted:  Strep negative COVID, flu negative Pregnancy test negative CBC leukocytosis at 11.0 CT neck with diffuse pharyngitis  Patient reassessed.  Pain significantly improved.  Tolerating p.o. intake.  Normal voice. Discussed CT findings.  Likely viral in etiology however does not PCP follow-up will DC home with antibiotics in case symptoms do not improve over the next week Shelby Blevins may start them at that point.  Discussed strict return precautions.  Patient voiced understanding.  The patient has been appropriately medically screened and/or stabilized in the ED. I have low suspicion for any other emergent medical condition which would require further screening, evaluation or treatment in the ED or require inpatient management.  Patient is hemodynamically stable and in no acute distress.  Patient able to ambulate in department prior to ED.  Evaluation does not show acute pathology that would require ongoing or additional emergent interventions while in the emergency department or further inpatient treatment.  I have discussed the diagnosis with the patient and answered all questions.  Pain is been managed while in the emergency department and patient has no further complaints prior to discharge.  Patient is comfortable with plan discussed in room and is stable for discharge at this time.  I have discussed strict return precautions for returning to the emergency department.  Patient was encouraged to follow-up with PCP/specialist refer to at discharge.     MDM Rules/Calculators/A&P                           Alba Formanek was evaluated in Emergency Department on 01/24/2021 for the symptoms described in the history of present illness. Shelby Blevins was evaluated in the context of the global COVID-19 pandemic, which necessitated consideration that the patient might be at risk for infection with the SARS-CoV-2 virus that causes COVID-19. Institutional protocols and algorithms that pertain to the evaluation of  patients at risk for COVID-19 are in a state of rapid change based on information released by regulatory bodies including the CDC and federal and state organizations. These policies and algorithms were followed during the patient's care in the ED.  Final Clinical Impression(s) / ED Diagnoses Final diagnoses:  Pharyngitis, unspecified etiology    Rx / DC Orders ED Discharge Orders          Ordered    clindamycin (CLEOCIN) 300 MG capsule  3 times daily  01/24/21 1434    naproxen (NAPROSYN) 500 MG tablet  2 times daily        01/24/21 1434    lidocaine (XYLOCAINE) 2 % solution  As needed        01/24/21 1434             Allyssia Skluzacek A, PA-C 01/24/21 1436    Tegeler, Canary Brim, MD 01/24/21 [phone]

## 2021-01-24 NOTE — Discharge Instructions (Addendum)
Take the antibiotics as prescribed. Return for new or worsening symptoms. °

## 2021-01-24 NOTE — ED Provider Notes (Signed)
Emergency Medicine Provider Triage Evaluation Note  Shelby Blevins , a 20 y.o. female  was evaluated in triage.  Pt complains of sore throat, fever and cough. Began last week. Pain with swallowing.  Subjective fevers on Monday and Tuesday.  No chest pain, shortness of breath.  No known COVID exposures.  Coworker was sick last week however they were both wearing masks for  Review of Systems  Positive: Sore throat, subjective fever, cough Negative: Chest pain, shortness of breath    Physical Exam  BP (!) 119/95   Pulse 90   Temp 98.4 F (36.9 C) (Oral)   Resp 17   SpO2 96%  Gen:   Awake, no distress   Resp:  Normal effort, clear Bl. No stridor Mouth:  Posterior pharynx with mild erythema.  No evidence of tonsillar edema or exudate.  Uvula midline.  No pooling of secretions.  No obvious PTA or RPA MSK:   Moves extremities without difficulty  Other:    Medical Decision Making  Medically screening exam initiated at 9:56 AM.  Appropriate orders placed.  Tarren Buell was informed that the remainder of the evaluation will be completed by another provider, this initial triage assessment does not replace that evaluation, and the importance of remaining in the ED until their evaluation is complete.  Sore throat, fever, cough  No acute respiratory distress, tolerating secretions.  Vital signs stable.   Linwood Dibbles, PA-C 01/24/21 0932    Tegeler, Canary Brim, MD 01/24/21 1110

## 2021-01-24 NOTE — ED Triage Notes (Signed)
Pt here with sore throat since last week. Pt endorses fevers on Monday and Tuesday. Pt able to manage secretions without difficulty.

## 2021-01-26 ENCOUNTER — Emergency Department (HOSPITAL_COMMUNITY)
Admission: EM | Admit: 2021-01-26 | Discharge: 2021-01-26 | Disposition: A | Discharge status: Home / Self Care | Payer: BC Managed Care – PPO | Attending: Emergency Medicine | Admitting: Emergency Medicine

## 2021-01-26 ENCOUNTER — Encounter (HOSPITAL_COMMUNITY): Payer: Self-pay

## 2021-01-26 DIAGNOSIS — J029 Acute pharyngitis, unspecified: Secondary | ICD-10-CM | POA: Insufficient documentation

## 2021-01-26 DIAGNOSIS — R0981 Nasal congestion: Secondary | ICD-10-CM | POA: Insufficient documentation

## 2021-01-26 DIAGNOSIS — R509 Fever, unspecified: Secondary | ICD-10-CM | POA: Diagnosis not present

## 2021-01-26 DIAGNOSIS — R079 Chest pain, unspecified: Secondary | ICD-10-CM | POA: Diagnosis not present

## 2021-01-26 DIAGNOSIS — H9201 Otalgia, right ear: Secondary | ICD-10-CM | POA: Diagnosis not present

## 2021-01-26 DIAGNOSIS — F17200 Nicotine dependence, unspecified, uncomplicated: Secondary | ICD-10-CM | POA: Insufficient documentation

## 2021-01-26 DIAGNOSIS — Z20822 Contact with and (suspected) exposure to covid-19: Secondary | ICD-10-CM | POA: Diagnosis not present

## 2021-01-26 LAB — CBC WITH DIFFERENTIAL/PLATELET
Abs Immature Granulocytes: 0.02 10*3/uL (ref 0.00–0.07)
Basophils Absolute: 0 10*3/uL (ref 0.0–0.1)
Basophils Relative: 0 %
Eosinophils Absolute: 0.1 10*3/uL (ref 0.0–0.5)
Eosinophils Relative: 1 %
HCT: 39.2 % (ref 36.0–46.0)
Hemoglobin: 12.6 g/dL (ref 12.0–15.0)
Immature Granulocytes: 0 %
Lymphocytes Relative: 50 %
Lymphs Abs: 3.7 10*3/uL (ref 0.7–4.0)
MCH: 26.9 pg (ref 26.0–34.0)
MCHC: 32.1 g/dL (ref 30.0–36.0)
MCV: 83.6 fL (ref 80.0–100.0)
Monocytes Absolute: 0.5 10*3/uL (ref 0.1–1.0)
Monocytes Relative: 6 %
Neutro Abs: 3.3 10*3/uL (ref 1.7–7.7)
Neutrophils Relative %: 43 %
Platelets: 346 10*3/uL (ref 150–400)
RBC: 4.69 MIL/uL (ref 3.87–5.11)
RDW: 13.6 % (ref 11.5–15.5)
WBC: 7.6 10*3/uL (ref 4.0–10.5)
nRBC: 0 % (ref 0.0–0.2)

## 2021-01-26 LAB — RESP PANEL BY RT-PCR (FLU A&B, COVID) ARPGX2
Influenza A by PCR: NEGATIVE
Influenza B by PCR: NEGATIVE
SARS Coronavirus 2 by RT PCR: NEGATIVE

## 2021-01-26 LAB — BASIC METABOLIC PANEL
Anion gap: 5 (ref 5–15)
BUN: 17 mg/dL (ref 6–20)
CO2: 28 mmol/L (ref 22–32)
Calcium: 8.8 mg/dL — ABNORMAL LOW (ref 8.9–10.3)
Chloride: 103 mmol/L (ref 98–111)
Creatinine, Ser: 0.71 mg/dL (ref 0.44–1.00)
GFR, Estimated: >60 mL/min (ref >60)
Glucose, Bld: 91 mg/dL (ref 70–99)
Potassium: 3.9 mmol/L (ref 3.5–5.1)
Sodium: 136 mmol/L (ref 135–145)

## 2021-01-26 LAB — TROPONIN I (HIGH SENSITIVITY): Troponin I (High Sensitivity): 2 ng/L (ref <18)

## 2021-01-26 LAB — GROUP A STREP BY PCR: Group A Strep by PCR: NOT DETECTED

## 2021-01-26 MED ORDER — DEXAMETHASONE SODIUM PHOSPHATE 10 MG/ML IJ SOLN
10.0000 mg | Freq: Once | INTRAMUSCULAR | Status: AC
  Administered 2021-01-26: 10 mg via INTRAMUSCULAR
  Filled 2021-01-26: qty 1

## 2021-01-26 NOTE — ED Provider Notes (Signed)
[facility]-EMERGENCY DEPT Provider Note   CSN: [id number] Arrival date & time: 01/26/21  0931     History Chief Complaint  Patient presents with   Sore Throat    Shelby Blevins is a 20 y.o. female presents to the ED for evaluation of constant sore throat for 9 days.  Worsens with eating or swallowing.  Patient was recently seen here 2 days ago on 01/24/2021 for evaluation of similar symptoms.  Patient reports her pain persist despite clindamycin and lidocaine.  Patient reports that she is also experiencing some constant pressure chest pain that is worse with movement.  Additionally, she mentions some nasal congestion, subjective fevers, and right ear pain.  She mentions she has been having some episodes of vomiting after taking the lidocaine.  She denies any nausea, abdominal pain, constipation, diarrhea, or shortness of breath.  No antipyretic medications taken. Denies any medical history.  Surgical history includes tonsillectomy.  No daily medications.  No known drug allergies.  Former smoker.  Sore Throat Pertinent negatives include no chest pain, no abdominal pain and no shortness of breath.      History reviewed. No pertinent past medical history.  There are no problems to display for this patient.   Past Surgical History:  Procedure Laterality Date   TONSILLECTOMY       OB History   No obstetric history on file.     History reviewed. No pertinent family history.  Social History   Tobacco Use   Smoking status: Some Days   Smokeless tobacco: Never  Substance Use Topics   Alcohol use: Yes   Drug use: No    Home Medications Prior to Admission medications   Medication Sig Start Date End Date Taking? Authorizing Provider  clindamycin (CLEOCIN) 300 MG capsule Take 1 capsule (300 mg total) by mouth 3 (three) times daily for 7 days. 01/24/21 01/31/21  Henderly, Britni A, PA-C  lidocaine (XYLOCAINE) 2 % solution Use as directed 15 mLs in the mouth  or throat as needed for mouth pain. 01/24/21   Henderly, Britni A, PA-C  melatonin 5 MG TABS Take 5 mg by mouth at bedtime as needed (sleep aid).    [provider]  Multiple Vitamin (MULTIVITAMIN) tablet Take 1 tablet by mouth daily.    [provider]  naproxen (NAPROSYN) 500 MG tablet Take 1 tablet (500 mg total) by mouth 2 (two) times daily. 01/24/21   Henderly, Britni A, PA-C  Probiotic Product (PROBIOTIC PO) Take 1 capsule by mouth daily.    [provider]    Allergies    Patient has no known allergies.  Review of Systems   Review of Systems  Constitutional:  Positive for fever. Negative for chills.  HENT:  Positive for congestion, ear pain and sore throat. Negative for drooling, rhinorrhea and trouble swallowing.   Eyes:  Negative for pain and visual disturbance.  Respiratory:  Negative for cough and shortness of breath.   Cardiovascular:  Negative for chest pain and palpitations.  Gastrointestinal:  Positive for vomiting. Negative for abdominal pain, constipation, diarrhea and nausea.  Genitourinary:  Negative for dysuria and hematuria.  Musculoskeletal:  Negative for arthralgias and back pain.  Skin:  Negative for color change and rash.  Neurological:  Negative for seizures and syncope.  All other systems reviewed and are negative.  Physical Exam Updated Vital Signs BP 92/78   Pulse 68   Temp 98.9 F (37.2 C) (Oral)   Resp 20   LMP  01/16/2021   SpO2 100%   Physical Exam Vitals and nursing note reviewed.  Constitutional:      General: She is not in acute distress.    Appearance: Normal appearance. She is not toxic-appearing.     Comments: She is speaking in full sentences with ease  HENT:     Head: Normocephalic and atraumatic.     Right Ear: Tympanic membrane and ear canal normal. No tenderness. No middle ear effusion. Tympanic membrane is not erythematous.     Left Ear: Tympanic membrane and ear canal normal. No tenderness.  No middle  ear effusion. Tympanic membrane is not erythematous.     Ears:     Comments: No mastoid tenderness. No overlying skin changes.     Nose: Congestion present.     Comments: Bilateral nares are erythematous and edematous with scant nasal clear discharge    Mouth/Throat:     Mouth: Mucous membranes are moist.     Pharynx: Uvula midline. No pharyngeal swelling, oropharyngeal exudate or uvula swelling.     Tonsils: No tonsillar exudate.     Comments: Mild pharyngeal erythema.  No overt exudate or lesions.  Few petechiae spotted on patient's uvula.  Uvula midline.  No tonsils visualized. Eyes:     General: No scleral icterus. Cardiovascular:     Rate and Rhythm: Normal rate and regular rhythm.  Pulmonary:     Effort: Pulmonary effort is normal. No respiratory distress.     Breath sounds: Normal breath sounds. No stridor. No wheezing or rhonchi.  Abdominal:     General: Abdomen is flat. Bowel sounds are normal.     Palpations: Abdomen is soft.     Tenderness: There is no abdominal tenderness. There is no guarding or rebound.  Musculoskeletal:        General: No deformity.     Cervical back: Normal range of motion and neck supple.  Lymphadenopathy:     Cervical: Cervical adenopathy present.  Skin:    General: Skin is warm and dry.     Findings: No rash.  Neurological:     General: No focal deficit present.     Mental Status: She is alert. Mental status is at baseline.    ED Results / Procedures / Treatments   Labs (all labs ordered are listed, but only abnormal results are displayed) Labs Reviewed  BASIC METABOLIC PANEL - Abnormal; Notable for the following components:      Result Value   Calcium 8.8 (*)    All other components within normal limits  RESP PANEL BY RT-PCR (FLU A&B, COVID) ARPGX2  GROUP A STREP BY PCR  CBC WITH DIFFERENTIAL/PLATELET  TROPONIN I (HIGH SENSITIVITY)    EKG EKG Interpretation  Date/Time:  Saturday January 26 2021 09:56:46 EDT Ventricular Rate:   73 PR Interval:  192 QRS Duration: 98 QT Interval:  391 QTC Calculation: 431 R Axis:     Text Interpretation: NO STEMI Confirmed by Alvester Chou [phone]) on 01/26/2021 1:23:01 PM  Radiology No results found.  Procedures Procedures   Medications Ordered in ED Medications  dexamethasone (DECADRON) injection 10 mg (10 mg Intramuscular Given 01/26/21 1342)    ED Course  I have reviewed the triage vital signs and the nursing notes.  Pertinent labs & imaging results that were available during my care of the patient were reviewed by me and considered in my medical decision making (see chart for details).  Shelby Blevins presents emergency department for evaluation of persistent sore throat  for the past 9 days.  Differential diagnosis includes but is not limited to PTA, pharyngitis, COVID, flu, allergic reaction, viral syndrome.  On exam, patient is speaking in full sentences with ease.  Uvula midline.  Few petechiae visualized on uvula, but none in the posterior oropharynx.  No swelling noticed.  TMs clear.  Nose shows nasal congestion with scant nasal discharge.  Patient does have some anterior cervical adenopathy, but no supraclavicular lymph nodes. Given physical exam, low suspicion for PTA.  Patient is tolerating secretions well.  I personally reviewed the patient's labs.  CBC shows downtrending white blood cell count from 11.0 to 7.6 today.  No anemia.  BMP shows slightly decreased calcium.  Troponin 2.  Given patient interview and exam, attending recommended no delta troponin needed.  Respiratory panel is negative for COVID, flu, and strep.  Vital signs stable, satting 100% on room air.  Afebrile.  Reading her previous notes, her physical exam is improving, although she may not feel like it.  CT of her neck included in this chart from 01/24/2021.  The patient was given 10 mg IM of Decadron for relief of symptoms.  I recommended that she continue taking her clindamycin and lidocaine.   Strict return precautions given such as shortness of breath, trouble swallowing, drooling, shortness of breath, increased chest pain to return to the nearest emergency department immediately.  Patient agrees with plan.  Patient is stable and being discharged home in good condition.  I discussed this case with my attending physician who cosigned this note including patient's presenting symptoms, physical exam, and planned diagnostics and interventions. Attending physician stated agreement with plan or made changes to plan which were implemented.     MDM Rules/Calculators/A&P                          Final Clinical Impression(s) / ED Diagnoses Final diagnoses:  Pharyngitis, unspecified etiology    Rx / DC Orders ED Discharge Orders     None        Achille Rich, PA-C 01/26/21 1526    Terald Sleeper, MD 01/27/21 [phone]

## 2021-01-26 NOTE — ED Provider Notes (Signed)
Emergency Medicine Provider Triage Evaluation Note  Shelby Blevins , a 20 y.o. female  was evaluated in triage.  Pt complains of sore throat gradually worsening for 9 days. Worsens with swallowing. Was seen at the ED 3 days ago and given antibiotics. Subjective fever. She thinks the lidocaine is making her vomit. Denies any nausea.   Review of Systems  Positive: Sore throat, SOB, [place], chest pain, vomiting Negative: nausea  Physical Exam  BP 119/76 (BP Location: Left Arm)   Pulse 72   Temp 97.7 F (36.5 C) (Oral)   Resp 18   LMP 01/16/2021   SpO2 96%  Gen:   Awake, no distress   Resp:  Normal effort  MSK:   Moves extremities without difficulty  Other:  Controlling secretions..  No acute distress.  Patient speaking in full sentences with ease.  Pharyngeal erythema with petechiae.  Medical Decision Making  Medically screening exam initiated at 9:38 AM.  Appropriate orders placed.  Shelby Blevins was informed that the remainder of the evaluation will be completed by another provider, this initial triage assessment does not replace that evaluation, and the importance of remaining in the ED until their evaluation is complete.  Labs ordered.   Achille Rich, PA-C 01/26/21 5638    Terald Sleeper, MD 01/26/21 [phone]

## 2021-01-26 NOTE — ED Triage Notes (Signed)
Per EMS, sore throat  with cough x1 week. Dx with laryngitis. Taking abx, NSAID, and lidocaine as prescribed.

## 2021-01-26 NOTE — ED Triage Notes (Signed)
Pt presents with c/o sore throat that started 9 days ago.

## 2021-01-26 NOTE — Discharge Instructions (Addendum)
You were here for evaluation of your sore throat, chest pain, nasal congestion, and subjective fevers.  Your labs were negative for COVID, strep, and the flu.  Your blood count was normal, showing no signs of infection.  Please return to the nearest emergency department if you have any shortness of breath, trouble swallowing, drooling, worsening pain, chest pain, and/or shortness of breath.

## 2021-03-19 DIAGNOSIS — J019 Acute sinusitis, unspecified: Secondary | ICD-10-CM | POA: Insufficient documentation

## 2021-03-19 DIAGNOSIS — R059 Cough, unspecified: Secondary | ICD-10-CM | POA: Diagnosis present

## 2021-03-19 DIAGNOSIS — Z20822 Contact with and (suspected) exposure to covid-19: Secondary | ICD-10-CM | POA: Insufficient documentation

## 2021-03-19 DIAGNOSIS — F17200 Nicotine dependence, unspecified, uncomplicated: Secondary | ICD-10-CM | POA: Diagnosis not present

## 2021-03-19 DIAGNOSIS — J40 Bronchitis, not specified as acute or chronic: Secondary | ICD-10-CM | POA: Diagnosis not present

## 2021-03-20 ENCOUNTER — Encounter (HOSPITAL_COMMUNITY): Payer: Self-pay

## 2021-03-20 ENCOUNTER — Emergency Department (HOSPITAL_COMMUNITY)
Admission: EM | Admit: 2021-03-20 | Discharge: 2021-03-20 | Disposition: A | Discharge status: Home / Self Care | Payer: BC Managed Care – PPO | Attending: Emergency Medicine | Admitting: Emergency Medicine

## 2021-03-20 ENCOUNTER — Other Ambulatory Visit: Payer: Self-pay

## 2021-03-20 DIAGNOSIS — J40 Bronchitis, not specified as acute or chronic: Secondary | ICD-10-CM

## 2021-03-20 DIAGNOSIS — J019 Acute sinusitis, unspecified: Secondary | ICD-10-CM | POA: Diagnosis not present

## 2021-03-20 LAB — RESP PANEL BY RT-PCR (FLU A&B, COVID) ARPGX2
Influenza A by PCR: NEGATIVE
Influenza B by PCR: NEGATIVE
SARS Coronavirus 2 by RT PCR: NEGATIVE

## 2021-03-20 MED ORDER — BENZONATATE 100 MG PO CAPS: 100.0000 mg | ORAL_CAPSULE | Freq: Three times a day (TID) | ORAL | 0 refills | Status: DC

## 2021-03-20 MED ORDER — AMOXICILLIN-POT CLAVULANATE 875-125 MG PO TABS: 1.0000 | ORAL_TABLET | Freq: Two times a day (BID) | ORAL | 0 refills | Status: DC

## 2021-03-20 MED ORDER — FLUTICASONE PROPIONATE 50 MCG/ACT NA SUSP: 2.0000 | Freq: Every day | NASAL | 0 refills | Status: DC

## 2021-03-20 MED ORDER — ALBUTEROL SULFATE HFA 108 (90 BASE) MCG/ACT IN AERS
2.0000 | INHALATION_SPRAY | RESPIRATORY_TRACT | Status: DC | PRN
  Administered 2021-03-20: 2 via RESPIRATORY_TRACT
  Filled 2021-03-20: qty 6.7

## 2021-03-20 NOTE — ED Provider Notes (Signed)
Emergency Medicine Provider Triage Evaluation Note  Shelby Blevins , a 20 y.o. female  was evaluated in triage.  Pt complains of cold symptoms x 1 week.  States she wanted to be checked for covid/flu because she has graduation this weekend.  Review of Systems  Positive: Nasal congestion, cough Negative: fever  Physical Exam  BP 135/86 (BP Location: Left Arm)   Pulse 96   Temp 98 F (36.7 C) (Oral)   Resp 16   SpO2 99%   Gen:   Awake, no distress   Resp:  Normal effort  MSK:   Moves extremities without difficulty  Other:  Sounds congested  Medical Decision Making  Medically screening exam initiated at 12:21 AM.  Appropriate orders placed.  Shelby Blevins was informed that the remainder of the evaluation will be completed by another provider, this initial triage assessment does not replace that evaluation, and the importance of remaining in the ED until their evaluation is complete.  Covid/flu screen sent.   Garlon Hatchet, PA-C 03/20/21 Belva Crome, MD 03/20/21 [phone]

## 2021-03-20 NOTE — ED Triage Notes (Signed)
Pt arrives POV for eval of cold and flu symptoms x1.5 weeks. Denies cold chills.

## 2021-03-20 NOTE — Discharge Instructions (Addendum)
Please read and follow all provided instructions.  Your diagnoses today include:  1. Acute non-recurrent sinusitis, unspecified location   2. Bronchitis     Tests performed today include: COVID/flu testing: Negative Vital signs. See below for your results today.   Medications prescribed:  Albuterol inhaler - medication that opens up your airway  Use inhaler as follows: 1-2 puffs with spacer every 4 hours as needed for wheezing, cough, or shortness of breath.   Tessalon Perles - cough suppressant medication  Augmentin - antibiotic  You have been prescribed an antibiotic medicine: take the entire course of medicine even if you are feeling better. Stopping early can cause the antibiotic not to work.  Take any prescribed medications only as directed.  Home care instructions:  Follow any educational materials contained in this packet.  Follow-up instructions: Please follow-up with your primary care provider in the next 3 days for further evaluation of your symptoms and a recheck if you are not feeling better.   Return instructions:  Please return to the Emergency Department if you experience worsening symptoms. Please return with worsening wheezing, shortness of breath, or difficulty breathing. Return with persistent fever above 101F.  Please return if you have any other emergent concerns.  Additional Information:  Your vital signs today were: BP 130/86 (BP Location: Left Arm)   Pulse 94   Temp 98.2 F (36.8 C) (Oral)   Resp 17   Ht 5\' 6"  (1.676 m)   Wt 91 kg   SpO2 98%   BMI 32.38 kg/m  If your blood pressure (BP) was elevated above 135/85 this visit, please have this repeated by your doctor within one month. --------------

## 2021-03-20 NOTE — ED Provider Notes (Signed)
[facility] EMERGENCY DEPARTMENT Provider Note   CSN: [id number] Arrival date & time: 03/19/21  2343     History Chief Complaint  Patient presents with   URI    Shelby Blevins is a 20 y.o. female.  Patient with no significant past medical history presents to the emergency department for greater than 1 week of URI symptoms and cough.  Patient describes nasal congestion and sinus pressure.  She states that it hurts to have her glasses on her face because of the pressure.  She has had a dry nonproductive cough with occasional wheezing.  History of asthma in childhood but does not currently take any controller medications.  Her ears have been stopped up a bit.  No ear pain.  No headache or fever.  No hemoptysis, shortness of breath.  No vomiting or diarrhea.  Patient is due to graduate from college this weekend.      History reviewed. No pertinent past medical history.  There are no problems to display for this patient.   Past Surgical History:  Procedure Laterality Date   TONSILLECTOMY       OB History   No obstetric history on file.     History reviewed. No pertinent family history.  Social History   Tobacco Use   Smoking status: Some Days   Smokeless tobacco: Never  Substance Use Topics   Alcohol use: Yes   Drug use: No    Home Medications Prior to Admission medications   Medication Sig Start Date End Date Taking? Authorizing Provider  amoxicillin-clavulanate (AUGMENTIN) 875-125 MG tablet Take 1 tablet by mouth every 12 (twelve) hours. 03/20/21  Yes Renne Crigler, PA-C  benzonatate (TESSALON) 100 MG capsule Take 1 capsule (100 mg total) by mouth every 8 (eight) hours. 03/20/21  Yes Renne Crigler, PA-C  fluticasone (FLONASE) 50 MCG/ACT nasal spray Place 2 sprays into both nostrils daily. 03/20/21  Yes Renne Crigler, PA-C  lidocaine (XYLOCAINE) 2 % solution Use as directed 15 mLs in the mouth or throat as needed for mouth pain. 01/24/21   Henderly,  Britni A, PA-C  melatonin 5 MG TABS Take 5 mg by mouth at bedtime as needed (sleep aid).    [provider]  Multiple Vitamin (MULTIVITAMIN) tablet Take 1 tablet by mouth daily.    [provider]  naproxen (NAPROSYN) 500 MG tablet Take 1 tablet (500 mg total) by mouth 2 (two) times daily. 01/24/21   Henderly, Britni A, PA-C  Probiotic Product (PROBIOTIC PO) Take 1 capsule by mouth daily.    [provider]    Allergies    Patient has no known allergies.  Review of Systems   Review of Systems  Constitutional:  Negative for chills, fatigue and fever.  HENT:  Positive for congestion and sinus pressure. Negative for ear pain, rhinorrhea and sore throat (occasional).   Eyes:  Negative for redness.  Respiratory:  Positive for cough and wheezing. Negative for shortness of breath.   Gastrointestinal:  Negative for abdominal pain, diarrhea, nausea and vomiting.  Genitourinary:  Negative for dysuria.  Musculoskeletal:  Negative for myalgias and neck stiffness.  Skin:  Negative for rash.  Neurological:  Negative for headaches.  Hematological:  Negative for adenopathy.   Physical Exam Updated Vital Signs BP 130/86 (BP Location: Left Arm)   Pulse 94   Temp 98.2 F (36.8 C) (Oral)   Resp 17   Ht 5\' 6"  (1.676 m)   Wt 91 kg   SpO2 98%  BMI 32.38 kg/m   Physical Exam Vitals and nursing note reviewed.  Constitutional:      Appearance: She is well-developed.  HENT:     Head: Normocephalic and atraumatic.     Jaw: No trismus.     Right Ear: Tympanic membrane, ear canal and external ear normal.     Left Ear: Tympanic membrane, ear canal and external ear normal.     Nose: Mucosal edema and congestion present. No rhinorrhea.     Right Turbinates: Enlarged.     Left Turbinates: Enlarged.     Right Sinus: Maxillary sinus tenderness and frontal sinus tenderness present.     Left Sinus: Maxillary sinus tenderness and frontal sinus tenderness present.      Mouth/Throat:     Mouth: Mucous membranes are moist. Mucous membranes are not dry. No oral lesions.     Pharynx: Uvula midline. No oropharyngeal exudate, posterior oropharyngeal erythema or uvula swelling.     Tonsils: No tonsillar abscesses.  Eyes:     General:        Right eye: No discharge.        Left eye: No discharge.     Conjunctiva/sclera: Conjunctivae normal.  Cardiovascular:     Rate and Rhythm: Normal rate and regular rhythm.     Heart sounds: Normal heart sounds.  Pulmonary:     Effort: Pulmonary effort is normal. No respiratory distress.     Breath sounds: Normal breath sounds. No wheezing or rales.  Abdominal:     Palpations: Abdomen is soft.     Tenderness: There is no abdominal tenderness.  Musculoskeletal:     Cervical back: Normal range of motion and neck supple.  Lymphadenopathy:     Cervical: No cervical adenopathy.  Skin:    General: Skin is warm and dry.  Neurological:     Mental Status: She is alert.  Psychiatric:        Mood and Affect: Mood normal.    ED Results / Procedures / Treatments   Labs (all labs ordered are listed, but only abnormal results are displayed) Labs Reviewed  RESP PANEL BY RT-PCR (FLU A&B, COVID) ARPGX2    EKG None  Radiology No results found.  Procedures Procedures   Medications Ordered in ED Medications  albuterol (VENTOLIN HFA) 108 (90 Base) MCG/ACT inhaler 2 puff (has no administration in time range)    ED Course  I have reviewed the triage vital signs and the nursing notes.  Pertinent labs & imaging results that were available during my care of the patient were reviewed by me and considered in my medical decision making (see chart for details).  Patient seen and examined. Plan discussed with patient.   Labs: Negative COVID and flu testing  Imaging: Not indicated  Medications/Fluids: We will discharge home with albuterol inhaler.  Vital signs reviewed and are as follows: BP 130/86 (BP Location: Left Arm)    Pulse 94   Temp 98.2 F (36.8 C) (Oral)   Resp 17   Ht 5\' 6"  (1.676 m)   Wt 91 kg   SpO2 98%   BMI 32.38 kg/m   Plan: Symptom control with Tessalon, Flonase, albuterol.  I had a shared decision-making discussion with the patient regarding antibiotics.  He has not been more than 1 week since her symptoms began.  We will trial Augmentin to see if this helps given upcoming graduation.  Encouraged use of probiotics for possible side effect of diarrhea.  Patient in agreement.  MDM Rules/Calculators/A&P                           Patient with signs and symptoms of bronchitis and sinusitis over 1 week.  Has not been improving as of yet.  Patient appears well, nontoxic.  No fevers.  Plan as above.  No concern for DVT/PE.  Vital signs are good.  Lungs clear at time of exam.  Low concern for pneumonia or significant asthma exacerbation.    Final Clinical Impression(s) / ED Diagnoses Final diagnoses:  Acute non-recurrent sinusitis, unspecified location  Bronchitis    Rx / DC Orders ED Discharge Orders          Ordered    benzonatate (TESSALON) 100 MG capsule  Every 8 hours        03/20/21 0752    fluticasone (FLONASE) 50 MCG/ACT nasal spray  Daily        03/20/21 0752    amoxicillin-clavulanate (AUGMENTIN) 875-125 MG tablet  Every 12 hours        03/20/21 0752             Renne Crigler, PA-C 03/20/21 0757    Gloris Manchester, MD 03/20/21 1700

## 2021-12-09 ENCOUNTER — Ambulatory Visit (HOSPITAL_COMMUNITY)
Admission: RE | Admit: 2021-12-09 | Discharge: 2021-12-09 | Disposition: A | Discharge status: Home / Self Care | Payer: BC Managed Care – PPO | Source: Ambulatory Visit | Attending: Family Medicine | Admitting: Family Medicine

## 2021-12-09 ENCOUNTER — Encounter (HOSPITAL_COMMUNITY): Payer: Self-pay

## 2021-12-09 VITALS — BP 100/70 | HR 111 | Temp 99.3°F | Resp 16 | Ht 66.0 in | Wt 220.0 lb

## 2021-12-09 DIAGNOSIS — J069 Acute upper respiratory infection, unspecified: Secondary | ICD-10-CM

## 2021-12-09 DIAGNOSIS — U071 COVID-19: Secondary | ICD-10-CM | POA: Diagnosis not present

## 2021-12-09 LAB — SARS CORONAVIRUS 2 (TAT 6-24 HRS): SARS Coronavirus 2: POSITIVE — AB

## 2021-12-09 LAB — POC INFLUENZA A AND B ANTIGEN (URGENT CARE ONLY)
INFLUENZA A ANTIGEN, POC: NEGATIVE
INFLUENZA B ANTIGEN, POC: NEGATIVE

## 2021-12-09 MED ORDER — PROMETHAZINE-DM 6.25-15 MG/5ML PO SYRP: 5.0000 mL | ORAL_SOLUTION | Freq: Four times a day (QID) | ORAL | 0 refills | Status: DC | PRN

## 2021-12-09 NOTE — ED Triage Notes (Signed)
Pt has a cough, runny nose, sore throat, chills, body aches, vomiting, diarrhea, loss of appetite , fever x3days. Pt states she took amox last night

## 2021-12-09 NOTE — Discharge Instructions (Addendum)
You have been tested for COVID-19 today. If your test returns positive, you will receive a phone call from [facility] regarding your results. Negative test results are not called. Both positive and negative results area always visible on MyChart. If you do not have a MyChart account, sign up instructions are provided in your discharge papers. Please do not hesitate to contact us should you have questions or concerns.  Your flu test was negative.

## 2021-12-11 NOTE — ED Provider Notes (Signed)
  [facility]   [id number] 12/09/21 Arrival Time: 3762  ASSESSMENT & PLAN:  1. Viral URI with cough    COVID +. No indication for antiviral tx. OTC symptom care as needed.  Discharge Medication List as of 12/09/2021 10:34 AM     START taking these medications   Details  promethazine-dextromethorphan (PROMETHAZINE-DM) 6.25-15 MG/5ML syrup Take 5 mLs by mouth 4 (four) times daily as needed for cough., Starting Mon 12/09/2021, Normal          Reviewed expectations re: course of current medical issues. Questions answered. Outlined signs and symptoms indicating need for more acute intervention. Understanding verbalized. After Visit Summary given.   SUBJECTIVE: History from: Patient. Shelby Blevins is a 21 y.o. female. Pt has a cough, runny nose, sore throat, chills, body aches, vomiting, diarrhea, loss of appetite , fever x3days. Pt states she took amox last night . Denies: headache.   OBJECTIVE:  Vitals:   12/09/21 0946 12/09/21 0947  BP:  100/70  Pulse:  (!) 111  Resp:  16  Temp:  99.3 F (37.4 C)  TempSrc:  Oral  SpO2:  98%  Weight: 99.8 kg   Height: 5\' 6"  (1.676 m)     Tachycardia noted.  General appearance: alert; no distress Eyes: PERRLA; EOMI; conjunctiva normal HENT: [place]; AT; with nasal congestion Neck: supple  Lungs: speaks full sentences without difficulty; unlabored Extremities: no edema Skin: warm and dry Neurologic: normal gait Psychological: alert and cooperative; normal mood and affect  Labs: Results for orders placed or performed during the hospital encounter of 12/09/21  SARS CORONAVIRUS 2 (TAT 6-24 HRS) Anterior Nasal Swab   Specimen: Anterior Nasal Swab  Result Value Ref Range   SARS Coronavirus 2 POSITIVE (A) NEGATIVE  POC Influenza A & B Ag (Urgent Care)  Result Value Ref Range   INFLUENZA A ANTIGEN, POC NEGATIVE NEGATIVE   INFLUENZA B ANTIGEN, POC NEGATIVE NEGATIVE   Labs Reviewed  SARS CORONAVIRUS 2 (TAT 6-24 HRS) -  Abnormal; Notable for the following components:      Result Value   SARS Coronavirus 2 POSITIVE (*)    All other components within normal limits  POC INFLUENZA A AND B ANTIGEN (URGENT CARE ONLY)    Imaging: No results found.  No Known Allergies  History reviewed. No pertinent past medical history. Social History   Socioeconomic History   Marital status: Single    Spouse name: Not on file   Number of children: Not on file   Years of education: Not on file   Highest education level: Not on file  Occupational History   Not on file  Tobacco Use   Smoking status: Some Days   Smokeless tobacco: Never  Substance and Sexual Activity   Alcohol use: Yes   Drug use: No   Sexual activity: Not on file  Other Topics Concern   Not on file  Social History Narrative   Not on file   Social Determinants of Health   Financial Resource Strain: Not on file  Food Insecurity: Not on file  Transportation Needs: Not on file  Physical Activity: Not on file  Stress: Not on file  Social Connections: Not on file  Intimate Partner Violence: Not on file   History reviewed. No pertinent family history. Past Surgical History:  Procedure Laterality Date   TONSILLECTOMY       12/11/21, MD 12/11/21 1116

## 2021-12-12 ENCOUNTER — Telehealth: Payer: BC Managed Care – PPO | Admitting: Family Medicine

## 2021-12-12 ENCOUNTER — Ambulatory Visit (HOSPITAL_COMMUNITY)
Admission: EM | Admit: 2021-12-12 | Discharge: 2021-12-12 | Disposition: A | Discharge status: Home / Self Care | Payer: BC Managed Care – PPO | Attending: Emergency Medicine | Admitting: Emergency Medicine

## 2021-12-12 ENCOUNTER — Encounter (HOSPITAL_COMMUNITY): Payer: Self-pay | Admitting: Emergency Medicine

## 2021-12-12 DIAGNOSIS — B349 Viral infection, unspecified: Secondary | ICD-10-CM

## 2021-12-12 DIAGNOSIS — U071 COVID-19: Secondary | ICD-10-CM

## 2021-12-12 DIAGNOSIS — H9209 Otalgia, unspecified ear: Secondary | ICD-10-CM

## 2021-12-12 MED ORDER — FLUTICASONE PROPIONATE 50 MCG/ACT NA SUSP: 2.0000 | Freq: Every day | NASAL | 0 refills | Status: DC

## 2021-12-12 MED ORDER — GUAIFENESIN ER 600 MG PO TB12: 600.0000 mg | ORAL_TABLET | Freq: Two times a day (BID) | ORAL | 0 refills | Status: AC

## 2021-12-12 NOTE — Progress Notes (Signed)
[place]   Questionable rupture of ear drum sharp pain prior to yellow green drainage. Covid + start of this week.  In person recommended for visual of ear drum  Patient acknowledged agreement and understanding of the plan.

## 2021-12-12 NOTE — ED Triage Notes (Signed)
Patient c/o right ear pain x 2 days.  Recently diagnosed w/COVID on Monday.  Patient has been taken Alka Seltzer, Tylenol and Ibuprofen.

## 2021-12-12 NOTE — Patient Instructions (Signed)
Please follow up with Urgent Care to have your ear looked at

## 2021-12-12 NOTE — ED Provider Notes (Signed)
[facility]    CSN: [id number] Arrival date & time: 12/12/21  1108      History   Chief Complaint Chief Complaint  Patient presents with   Otalgia    HPI Shelby Blevins is a 21 y.o. female.  Presents with 2 day history of right ear pain. 6/10, pressure Diagnosed with COVID on Monday. Has tried tylenol and ibuprofen. Reports lots of nasal congestion. No fever. No drainage from ears.  History reviewed. No pertinent past medical history.  There are no problems to display for this patient.   Past Surgical History:  Procedure Laterality Date   TONSILLECTOMY      OB History   No obstetric history on file.      Home Medications    Prior to Admission medications   Medication Sig Start Date End Date Taking? Authorizing Provider  guaiFENesin (MUCINEX) 600 MG 12 hr tablet Take 1 tablet (600 mg total) by mouth 2 (two) times daily for 5 days. 12/12/21 12/17/21 Yes Jonnell Hentges, Lurena Joiner, PA-C  fluticasone (FLONASE) 50 MCG/ACT nasal spray Place 2 sprays into both nostrils daily. 12/12/21   Earl Zellmer, Lurena Joiner PA-C    Family History No family history on file.  Social History Social History   Tobacco Use   Smoking status: Some Days   Smokeless tobacco: Never  Substance Use Topics   Alcohol use: Yes   Drug use: No     Allergies   Patient has no known allergies.   Review of Systems Review of Systems Per HPI  Physical Exam Triage Vital Signs ED Triage Vitals  Enc Vitals Group     BP 12/12/21 1154 123/86     Pulse Rate 12/12/21 1154 78     Resp 12/12/21 1154 18     Temp 12/12/21 1154 98.5 F (36.9 C)     Temp Source 12/12/21 1154 Oral     SpO2 12/12/21 1154 97 %     Weight 12/12/21 1158 220 lb (99.8 kg)     Height 12/12/21 1158 5\' 6"  (1.676 m)     Head Circumference --      Peak Flow --      Pain Score 12/12/21 1158 6     Pain Loc --      Pain Edu? --      Excl. in GC? --    No data found.  Updated Vital Signs BP 123/86 (BP Location: Right Arm)    Pulse 78   Temp 98.5 F (36.9 C) (Oral)   Resp 18   Ht 5\' 6"  (1.676 m)   Wt 220 lb (99.8 kg)   LMP 12/05/2021   SpO2 97%   BMI 35.51 kg/m    Physical Exam Vitals and nursing note reviewed.  Constitutional:      General: She is not in acute distress. HENT:     Right Ear: Tympanic membrane, ear canal and external ear normal.     Left Ear: Tympanic membrane, ear canal and external ear normal.     Ears:     Comments: Clear fluid behind right tm.    Nose: Congestion present.     Right Turbinates: Enlarged.     Left Turbinates: Enlarged.     Mouth/Throat:     Mouth: Mucous membranes are moist.     Pharynx: Oropharynx is clear.  Eyes:     Conjunctiva/sclera: Conjunctivae normal.  Cardiovascular:     Rate and Rhythm: Normal rate and regular rhythm.     Pulses: Normal pulses.  Heart sounds: Normal heart sounds.  Pulmonary:     Effort: Pulmonary effort is normal.     Breath sounds: Normal breath sounds.  Lymphadenopathy:     Cervical: No cervical adenopathy.  Neurological:     Mental Status: She is alert and oriented to person, place, and time.      UC Treatments / Results  Labs (all labs ordered are listed, but only abnormal results are displayed) Labs Reviewed - No data to display  EKG   Radiology No results found.  Procedures Procedures (including critical care time)  Medications Ordered in UC Medications - No data to display  Initial Impression / Assessment and Plan / UC Course  I have reviewed the triage vital signs and the nursing notes.  Pertinent labs & imaging results that were available during my care of the patient were reviewed by me and considered in my medical decision making (see chart for details).  Likely fluid buildup from nasal congestion. Recommend symptomatic care, decongestant/mucinex with or without nasal spray. Run hot water in shower to breathe in steam and loosen mucous. Return if symptoms persist despite care. Work note provided.  Patient agrees to plan  Final Clinical Impressions(s) / UC Diagnoses   Final diagnoses:  Lab test positive for detection of COVID-19 virus  Viral illness     Discharge Instructions      Try the mucinex twice daily for congestion and cough.  This can help relieve some of the pressure behind your ears. You can try the nasal spray once daily to also help with congestion.  You may need a few more days to recover from your illness. You can return to the urgent care if symptoms persist despite symptomatic treatment.    ED Prescriptions     Medication Sig Dispense Auth. Provider   fluticasone (FLONASE) 50 MCG/ACT nasal spray Place 2 sprays into both nostrils daily. 9.9 g Addysyn Fern, PA-C   guaiFENesin (MUCINEX) 600 MG 12 hr tablet Take 1 tablet (600 mg total) by mouth 2 (two) times daily for 5 days. 10 tablet Jerni Selmer, Lurena Joiner, PA-C      PDMP not reviewed this encounter.   Marlow Baars, Cordelia Poche 12/12/21 1349

## 2021-12-12 NOTE — Discharge Instructions (Addendum)
Try the mucinex twice daily for congestion and cough.  This can help relieve some of the pressure behind your ears. You can try the nasal spray once daily to also help with congestion.  You may need a few more days to recover from your illness. You can return to the urgent care if symptoms persist despite symptomatic treatment.

## 2021-12-19 ENCOUNTER — Ambulatory Visit: Admit: 2021-12-19 | Discharge status: Absent without leave | Payer: BC Managed Care – PPO

## 2021-12-19 ENCOUNTER — Ambulatory Visit (HOSPITAL_COMMUNITY)
Admission: EM | Admit: 2021-12-19 | Discharge: 2021-12-19 | Disposition: A | Discharge status: Home / Self Care | Payer: BC Managed Care – PPO | Attending: Urgent Care | Admitting: Urgent Care

## 2021-12-19 ENCOUNTER — Encounter (HOSPITAL_COMMUNITY): Payer: Self-pay | Admitting: Emergency Medicine

## 2021-12-19 ENCOUNTER — Other Ambulatory Visit: Payer: Self-pay

## 2021-12-19 ENCOUNTER — Ambulatory Visit (INDEPENDENT_AMBULATORY_CARE_PROVIDER_SITE_OTHER): Payer: BC Managed Care – PPO

## 2021-12-19 DIAGNOSIS — J22 Unspecified acute lower respiratory infection: Secondary | ICD-10-CM | POA: Insufficient documentation

## 2021-12-19 DIAGNOSIS — R059 Cough, unspecified: Secondary | ICD-10-CM

## 2021-12-19 DIAGNOSIS — U071 COVID-19: Secondary | ICD-10-CM | POA: Insufficient documentation

## 2021-12-19 DIAGNOSIS — J111 Influenza due to unidentified influenza virus with other respiratory manifestations: Secondary | ICD-10-CM | POA: Insufficient documentation

## 2021-12-19 DIAGNOSIS — R051 Acute cough: Secondary | ICD-10-CM | POA: Diagnosis present

## 2021-12-19 DIAGNOSIS — R0989 Other specified symptoms and signs involving the circulatory and respiratory systems: Secondary | ICD-10-CM | POA: Diagnosis present

## 2021-12-19 LAB — RESP PANEL BY RT-PCR (RSV, FLU A&B, COVID)  RVPGX2
Influenza A by PCR: NEGATIVE
Influenza B by PCR: NEGATIVE
Resp Syncytial Virus by PCR: NEGATIVE
SARS Coronavirus 2 by RT PCR: POSITIVE — AB

## 2021-12-19 LAB — POCT RAPID STREP A, ED / UC: Streptococcus, Group A Screen (Direct): NEGATIVE

## 2021-12-19 MED ORDER — BENZONATATE 100 MG PO CAPS: 100.0000 mg | ORAL_CAPSULE | Freq: Three times a day (TID) | ORAL | 0 refills | Status: AC

## 2021-12-19 MED ORDER — HYDROCOD POLI-CHLORPHE POLI ER 10-8 MG/5ML PO SUER: 5.0000 mL | Freq: Two times a day (BID) | ORAL | 0 refills | Status: AC | PRN

## 2021-12-19 MED ORDER — AZITHROMYCIN 250 MG PO TABS: ORAL_TABLET | ORAL | 0 refills | Status: DC

## 2021-12-19 NOTE — ED Triage Notes (Signed)
12/09/2021-covid test positive.   12/16/2021 went back to work.  Complains of throat soreness, fullness, aching, chills, and cough

## 2021-12-19 NOTE — ED Provider Notes (Signed)
[facility]   [id number] 12/19/21 Arrival Time: 1228  ASSESSMENT & PLAN:  1. Symptoms of upper respiratory infection (URI)   2. Influenza-like illness    Patient presents with symptoms of upper respiratory infection 1.5 weeks after positive COVID test.  Patient had recovered from most of her COVID symptoms and returned to work on 9/4, 3 days ago.  She states today that her symptoms have recurred and are progressively worsening since returning to work.  LCAB without adventitious breath sounds.  Pharynx erythematous without visible exudate.  Will obtain nasal swab 2-day for COVID/flu/RSV.  Presumptive positive COVID given previous recent test.  Concern for secondary viral infection versus bacterial.  Requesting chest x-ray given her reported dyspnea and elevated RR rate. Rapid strep ordered and negative.  See AVS for discharge instructions.  No orders of the defined types were placed in this encounter.  OTC symptom care as needed.  Benzonatate provided for day-time cough suppression and tussionex for nighttime (sedation). Recommended tylenol/ibuprofen for fever.  Ensure adequate fluid intake and rest.  May f/u with PCP or here as needed.  Reviewed expectations re: course of current medical issues. Questions answered.  Outlined signs and symptoms indicating need for more acute intervention.  Patient verbalized understanding.  After Visit Summary given.   SUBJECTIVE: History from: patient.  Out of work until Monday. Feeling bad Monday night. Trouble sleeping. Hard to breath through mouth and nose. mouth swells up. Myalgias, chills, cough. States same symptoms as with covid 1.5 weeks ago but worse.  Dyspnea, worse with exertion. "Breathing heavier than normal".   Shelby Blevins is a 21 y.o. female who presents with recurrent complaint of symptoms of upper respiratory infection following recovery from confirmed covid 1.5 weeks ago. Stating symptoms worse than initial  COVID course. Symptoms including nasal congestion, post-nasal drainage, and a persistent dry cough; with sore throat. Onset abrupt, 3 days ago; with fatigue, with chills and with body aches. SOB: moderate. Wheezing: none. Fever: present, moderate, 101-102+. Overall normal PO intake without n/v. Known sick contacts or COVID-19 exposure: yes. No specific or significant aggravating or alleviating factors reported. OTC treatment: .  Received flu shot this year: no.  Social History   Tobacco Use  Smoking Status Some Days  Smokeless Tobacco Never    ROS: As per HPI.   OBJECTIVE:  Vitals:   12/19/21 1248  BP: 100/70  Pulse: 85  Resp: (!) 22  Temp: 97.8 F (36.6 C)  TempSrc: Temporal  SpO2: 98%     General appearance: alert; appears fatigued HEENT: nasal congestion; clear runny nose; throat irritation secondary to post-nasal drainage Neck: supple without LAD CV: RRR Lungs: unlabored respirations, symmetrical air entry without wheezing; cough: mild Abd: soft Ext: no LE edema Skin: warm and dry Psychological: alert and cooperative; normal mood and affect  Imaging: No results found.  No Known Allergies  History reviewed. No pertinent past medical history. Family History  Problem Relation Age of Onset   Healthy Mother    Social History   Socioeconomic History   Marital status: Single    Spouse name: Not on file   Number of children: Not on file   Years of education: Not on file   Highest education level: Not on file  Occupational History   Not on file  Tobacco Use   Smoking status: Some Days   Smokeless tobacco: Never  Vaping Use   Vaping Use: Never used  Substance and Sexual Activity   Alcohol use: Yes  Drug use: No   Sexual activity: Not on file  Other Topics Concern   Not on file  Social History Narrative   Not on file   Social Determinants of Health   Financial Resource Strain: Not on file  Food Insecurity: Not on file  Transportation Needs: Not on  file  Physical Activity: Not on file  Stress: Not on file  Social Connections: Not on file  Intimate Partner Violence: Not on file            Charma Igo, [place] 12/19/21 1403

## 2021-12-19 NOTE — Discharge Instructions (Addendum)
Follow up with your primary care provider if your symptoms are not improving.     

## 2021-12-23 ENCOUNTER — Emergency Department (HOSPITAL_COMMUNITY)
Admission: EM | Admit: 2021-12-23 | Discharge: 2021-12-24 | Disposition: A | Discharge status: Home / Self Care | Payer: BC Managed Care – PPO | Attending: Emergency Medicine | Admitting: Emergency Medicine

## 2021-12-23 ENCOUNTER — Encounter (HOSPITAL_COMMUNITY): Payer: Self-pay | Admitting: *Deleted

## 2021-12-23 ENCOUNTER — Emergency Department (HOSPITAL_COMMUNITY): Payer: BC Managed Care – PPO

## 2021-12-23 ENCOUNTER — Other Ambulatory Visit: Payer: Self-pay

## 2021-12-23 DIAGNOSIS — R059 Cough, unspecified: Secondary | ICD-10-CM | POA: Insufficient documentation

## 2021-12-23 DIAGNOSIS — Z8616 Personal history of COVID-19: Secondary | ICD-10-CM | POA: Insufficient documentation

## 2021-12-23 DIAGNOSIS — R0602 Shortness of breath: Secondary | ICD-10-CM | POA: Diagnosis present

## 2021-12-23 DIAGNOSIS — U071 COVID-19: Secondary | ICD-10-CM

## 2021-12-23 DIAGNOSIS — R052 Subacute cough: Secondary | ICD-10-CM

## 2021-12-23 LAB — I-STAT CHEM 8, ED
BUN: 12 mg/dL (ref 6–20)
Calcium, Ion: 1.2 mmol/L (ref 1.15–1.40)
Chloride: 103 mmol/L (ref 98–111)
Creatinine, Ser: 0.6 mg/dL (ref 0.44–1.00)
Glucose, Bld: 82 mg/dL (ref 70–99)
HCT: 40 % (ref 36.0–46.0)
Hemoglobin: 13.6 g/dL (ref 12.0–15.0)
Potassium: 3.6 mmol/L (ref 3.5–5.1)
Sodium: 139 mmol/L (ref 135–145)
TCO2: 27 mmol/L (ref 22–32)

## 2021-12-23 LAB — COMPREHENSIVE METABOLIC PANEL
ALT: 14 U/L (ref 0–44)
AST: 15 U/L (ref 15–41)
Albumin: 3.7 g/dL (ref 3.5–5.0)
Alkaline Phosphatase: 51 U/L (ref 38–126)
Anion gap: 11 (ref 5–15)
BUN: 11 mg/dL (ref 6–20)
CO2: 23 mmol/L (ref 22–32)
Calcium: 9.3 mg/dL (ref 8.9–10.3)
Chloride: 104 mmol/L (ref 98–111)
Creatinine, Ser: 0.67 mg/dL (ref 0.44–1.00)
GFR, Estimated: >60 mL/min (ref >60)
Glucose, Bld: 87 mg/dL (ref 70–99)
Potassium: 3.6 mmol/L (ref 3.5–5.1)
Sodium: 138 mmol/L (ref 135–145)
Total Bilirubin: 1.1 mg/dL (ref 0.3–1.2)
Total Protein: 6.9 g/dL (ref 6.5–8.1)

## 2021-12-23 LAB — CBC WITH DIFFERENTIAL/PLATELET
Abs Immature Granulocytes: 0.02 10*3/uL (ref 0.00–0.07)
Basophils Absolute: 0 10*3/uL (ref 0.0–0.1)
Basophils Relative: 0 %
Eosinophils Absolute: 0.1 10*3/uL (ref 0.0–0.5)
Eosinophils Relative: 1 %
HCT: 40.5 % (ref 36.0–46.0)
Hemoglobin: 12.7 g/dL (ref 12.0–15.0)
Immature Granulocytes: 0 %
Lymphocytes Relative: 50 %
Lymphs Abs: 3.5 10*3/uL (ref 0.7–4.0)
MCH: 26.6 pg (ref 26.0–34.0)
MCHC: 31.4 g/dL (ref 30.0–36.0)
MCV: 84.7 fL (ref 80.0–100.0)
Monocytes Absolute: 0.7 10*3/uL (ref 0.1–1.0)
Monocytes Relative: 9 %
Neutro Abs: 2.8 10*3/uL (ref 1.7–7.7)
Neutrophils Relative %: 40 %
Platelets: 345 10*3/uL (ref 150–400)
RBC: 4.78 MIL/uL (ref 3.87–5.11)
RDW: 13.4 % (ref 11.5–15.5)
WBC: 7 10*3/uL (ref 4.0–10.5)
nRBC: 0 % (ref 0.0–0.2)

## 2021-12-23 LAB — I-STAT BETA HCG BLOOD, ED (MC, WL, AP ONLY): I-stat hCG, quantitative: <5 m[IU]/mL (ref <5)

## 2021-12-23 MED ORDER — METHYLPREDNISOLONE SODIUM SUCC 125 MG IJ SOLR
125.0000 mg | Freq: Once | INTRAMUSCULAR | Status: AC
  Administered 2021-12-23: 125 mg via INTRAVENOUS
  Filled 2021-12-23: qty 2

## 2021-12-23 MED ORDER — PREDNISONE 20 MG PO TABS: ORAL_TABLET | ORAL | 0 refills | Status: DC

## 2021-12-23 MED ORDER — METHYLPREDNISOLONE SODIUM SUCC 125 MG IJ SOLR: 125.0000 mg | Freq: Once | INTRAMUSCULAR | Status: DC

## 2021-12-23 MED ORDER — IOHEXOL 350 MG/ML SOLN
55.0000 mL | Freq: Once | INTRAVENOUS | Status: AC | PRN
  Administered 2021-12-23: 55 mL via INTRAVENOUS

## 2021-12-23 MED ORDER — ALBUTEROL SULFATE HFA 108 (90 BASE) MCG/ACT IN AERS
2.0000 | INHALATION_SPRAY | Freq: Once | RESPIRATORY_TRACT | Status: AC
  Administered 2021-12-23: 2 via RESPIRATORY_TRACT
  Filled 2021-12-23: qty 6.7

## 2021-12-23 NOTE — ED Provider Triage Note (Signed)
Emergency Medicine Provider Triage Evaluation Note  Shelby Blevins , a 21 y.o. female  was evaluated in triage.  Pt complains of ongoing shortness of breath with chest tightness since she was diagnosed with COVID on 12/09/2021.  Patient was seen at urgent care on Thursday was prescribed azithromycin, Tussionex and Tessalon Perles.  She has had no relief with these medications and has 1 more dose of azithromycin remaining.  She states that she feels she cannot take a deep breath and is constantly coughing.  She is unable to sleep at night due to how tight her chest feels.  Denies fever, chills, abdominal pain, nausea, vomiting and diarrhea.  Review of Systems  Positive:  Negative:   Physical Exam  BP 117/84   Pulse 83   Temp 98.6 F (37 C) (Oral)   Resp 18   Ht 5\' 6"  (1.676 m)   Wt 99.8 kg   LMP 12/05/2021   SpO2 99%   BMI 35.51 kg/m  Gen:   Awake, no distress   Resp:  Normal effort, lungs sound tight, minimal wheezing MSK:   Moves extremities without difficulty  Other:    Medical Decision Making  Medically screening exam initiated at 1:36 PM.  Appropriate orders placed.  Shelby Blevins was informed that the remainder of the evaluation will be completed by another provider, this initial triage assessment does not replace that evaluation, and the importance of remaining in the ED until their evaluation is complete.  IM dose of Solu-Medrol given here in triage given long waiting time   12/07/2021, Shelby Blevins 12/23/21 1338

## 2021-12-23 NOTE — Discharge Instructions (Signed)
You likely have persistent cough from COVID. Please take prednisone as prescribed.  Use albuterol every 4 hours as needed for cough  You can continue your cough medicine as prescribed by your doctor.  As we discussed, since your cough is from COVID infection, the cough medicines usually are not that effective  See your doctor for follow-up   Return to ER if you have worse cough, shortness of breath, fever

## 2021-12-23 NOTE — ED Triage Notes (Signed)
Patient c/o chest pain and sob with congestion states she tested positive for covid 8/28 c/o generalized  bodyaches states she can't sleep at night.

## 2021-12-23 NOTE — ED Provider Notes (Signed)
[facility] EMERGENCY DEPARTMENT Provider Note   CSN: [id number] Arrival date & time: 12/23/21  1238     History  Chief Complaint  Patient presents with   Shortness of Breath    Shelby Blevins is a 21 y.o. female history of recent COVID infection on 8/28 here presenting with shortness of breath and cough.  Patient was diagnosed with COVID at urgent care.  Was prescribed cough medicine.  Patient has persistent cough and shortness of breath.  Patient went back to urgent care multiple times and was prescribed azithromycin and also Tessalon Perles as well as Hycodan for cough.  She states that she has been feeling miserable.  She has difficulty sleeping due to coughing and shortness of breath.  Denies any fevers.  Patient had COVID infection last year and did not require any hospitalization.  The history is provided by the patient.       Home Medications Prior to Admission medications   Medication Sig Start Date End Date Taking? Authorizing Provider  azithromycin (ZITHROMAX Z-PAK) 250 MG tablet Take 2 tablets (500 mg) today, then 1 tablet (250 mg) for next 4 days. 12/19/21   Immordino, Jeannett Senior, FNP  benzonatate (TESSALON) 100 MG capsule Take 1 capsule (100 mg total) by mouth every 8 (eight) hours for 10 days. 12/19/21 12/29/21  Immordino, Jeannett Senior, FNP  chlorpheniramine-HYDROcodone (TUSSIONEX) 10-8 MG/5ML Take 5 mLs by mouth every 12 (twelve) hours as needed for up to 7 days for cough. 12/19/21 12/26/21  Immordino, Jeannett Senior, FNP  fluticasone (FLONASE) 50 MCG/ACT nasal spray Place 2 sprays into both nostrils daily. Patient not taking: Reported on 12/19/2021 12/12/21   Rising, Lurena Joiner, PA-C      Allergies    Patient has no known allergies.    Review of Systems   Review of Systems  Respiratory:  Positive for shortness of breath.   All other systems reviewed and are negative.   Physical Exam Updated Vital Signs BP 104/66   Pulse 72   Temp 98.6 F (37 C) (Oral)   Resp 20    Ht 5\' 6"  (1.676 m)   Wt 99.8 kg   LMP 12/05/2021   SpO2 100%   BMI 35.51 kg/m  Physical Exam Vitals and nursing note reviewed.  Constitutional:      Comments: Coughing, uncomfortable  HENT:     Head: Normocephalic.     Mouth/Throat:     Mouth: Mucous membranes are moist.  Eyes:     Extraocular Movements: Extraocular movements intact.     Pupils: Pupils are equal, round, and reactive to light.  Cardiovascular:     Rate and Rhythm: Normal rate and regular rhythm.  Pulmonary:     Comments: Tachypneic, mild diffuse wheezing Abdominal:     General: Bowel sounds are normal.     Palpations: Abdomen is soft.  Musculoskeletal:        General: Normal range of motion.  Skin:    General: Skin is warm.     Capillary Refill: Capillary refill takes less than 2 seconds.  Neurological:     General: No focal deficit present.     Mental Status: She is alert and oriented to person, place, and time.  Psychiatric:        Mood and Affect: Mood normal.        Behavior: Behavior normal.     ED Results / Procedures / Treatments   Labs (all labs ordered are listed, but only abnormal results are displayed) Labs Reviewed  CBC WITH DIFFERENTIAL/PLATELET  COMPREHENSIVE METABOLIC PANEL  I-STAT BETA HCG BLOOD, ED ([facility], [facility], [facility] ONLY)  I-STAT CHEM 8, ED    EKG None  Radiology CT Angio Chest PE W and/or Wo Contrast  Result Date: 12/23/2021 CLINICAL DATA:  COVID short of breath EXAM: CT ANGIOGRAPHY CHEST WITH CONTRAST TECHNIQUE: Multidetector CT imaging of the chest was performed using the standard protocol during bolus administration of intravenous contrast. Multiplanar CT image reconstructions and MIPs were obtained to evaluate the vascular anatomy. RADIATION DOSE REDUCTION: This exam was performed according to the departmental dose-optimization program which includes automated exposure control, adjustment of the mA and/or kV according to patient size and/or use of iterative reconstruction  technique. CONTRAST:  52mL OMNIPAQUE IOHEXOL 350 MG/ML SOLN COMPARISON:  Chest x-ray 12/23/2021 FINDINGS: Cardiovascular: Satisfactory opacification of the pulmonary arteries to the segmental level. No evidence of pulmonary embolism. Normal heart size. No pericardial effusion. Nonaneurysmal aorta. Mediastinum/Nodes: No enlarged mediastinal, hilar, or axillary lymph nodes. Thyroid gland, trachea, and esophagus demonstrate no significant findings. Lungs/Pleura: Lungs are clear. No pleural effusion or pneumothorax. Upper Abdomen: No acute abnormality. Musculoskeletal: No chest wall abnormality. No acute or significant osseous findings. Review of the MIP images confirms the above findings. IMPRESSION: 1. No CT evidence for acute pulmonary embolus. 2. Clear lung fields Electronically Signed   By: Jasmine Pang M.D.   On: 12/23/2021 23:27   DG Chest 2 View  Result Date: 12/23/2021 CLINICAL DATA:  Shortness of breath, COVID positive EXAM: CHEST - 2 VIEW COMPARISON:  12/19/2021 FINDINGS: The heart size and mediastinal contours are within normal limits. Both lungs are clear. The visualized skeletal structures are unremarkable. IMPRESSION: No active cardiopulmonary disease. Electronically Signed   By: Ernie Avena M.D.   On: 12/23/2021 14:12    Procedures Procedures    Medications Ordered in ED Medications  albuterol (VENTOLIN HFA) 108 (90 Base) MCG/ACT inhaler 2 puff (2 puffs Inhalation Given 12/23/21 2131)  methylPREDNISolone sodium succinate (SOLU-MEDROL) 125 mg/2 mL injection 125 mg (125 mg Intravenous Given 12/23/21 2133)  iohexol (OMNIPAQUE) 350 MG/ML injection 55 mL (55 mLs Intravenous Contrast Given 12/23/21 2301)    ED Course/ Medical Decision Making/ A&P                           Medical Decision Making Shelby Blevins is a 21 y.o. female here presenting with shortness of breath.  Patient had COVID about 2 weeks ago now.  Patient has persistent shortness of breath and cough.  Consider PE  versus pneumonia versus his long COVID.  Plan to get CTA chest to rule out PE.  We will also give albuterol and Solu-Medrol.  11:32 PM I reviewed patient's labs and independently interpreted CT scan.  CT showed no PE or any pneumonia.  Labs are unremarkable.  Felt better after steroids and albuterol.  Patient is already on cough medicine.  Will discharge home with course of steroids and albuterol as needed.  Problems Addressed: COVID-19: acute illness or injury Subacute cough: acute illness or injury  Amount and/or Complexity of Data Reviewed Labs: ordered. Decision-making details documented in ED Course. Radiology: ordered and independent interpretation performed. Decision-making details documented in ED Course.  Risk Prescription drug management.    Final Clinical Impression(s) / ED Diagnoses Final diagnoses:  None    Rx / DC Orders ED Discharge Orders     None         Charlynne Pander, MD 12/23/21  2332  

## 2022-06-28 IMAGING — CR DG KNEE COMPLETE 4+V*R*
4 series · 4 of 4 positions shown · non-contrast
Comparison: None.

CLINICAL DATA: Knee pain.

EXAM:
RIGHT KNEE - COMPLETE 4+ VIEW

[knee ap]
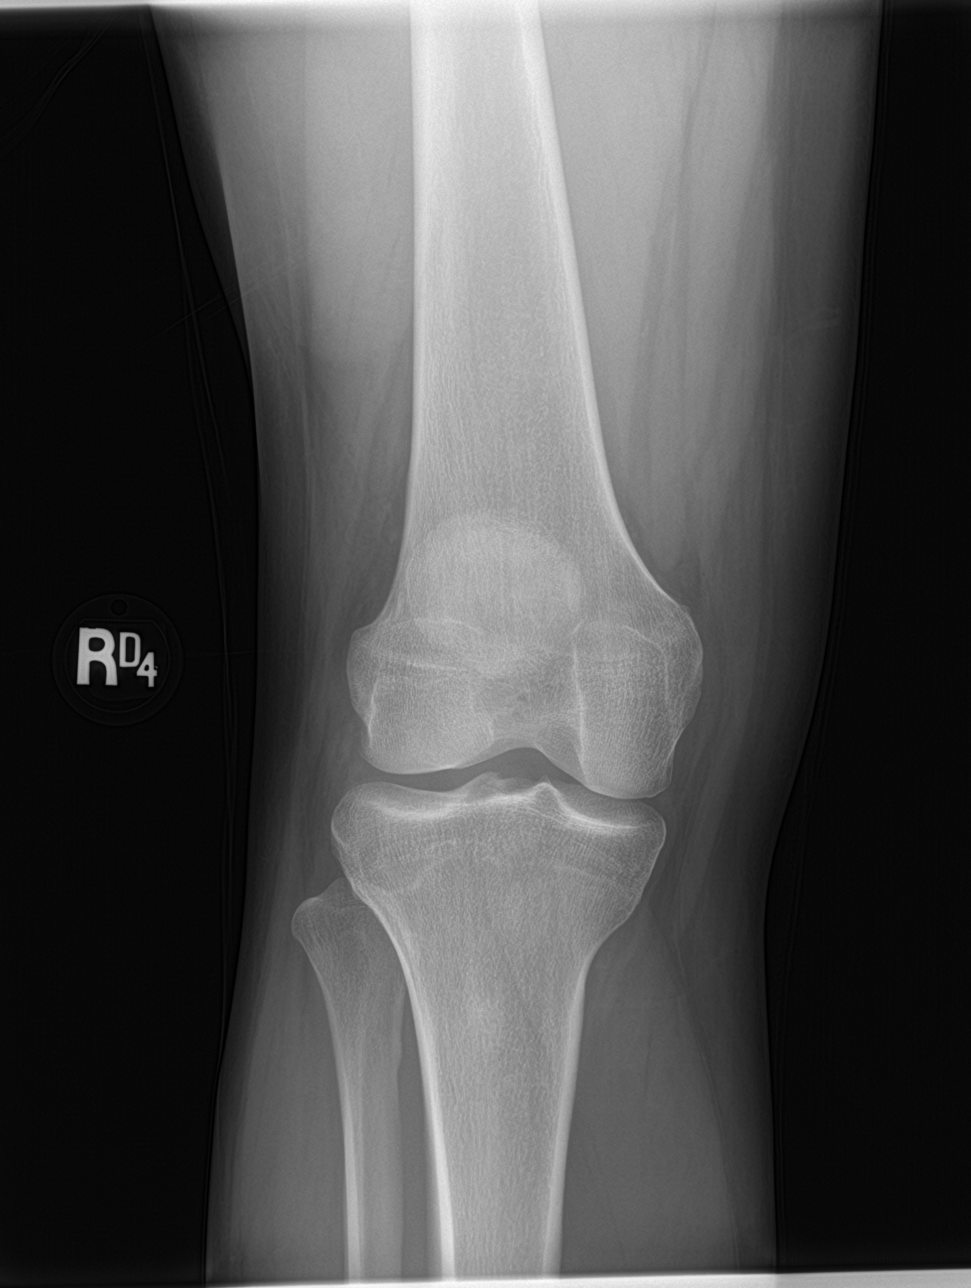

[knee lat]
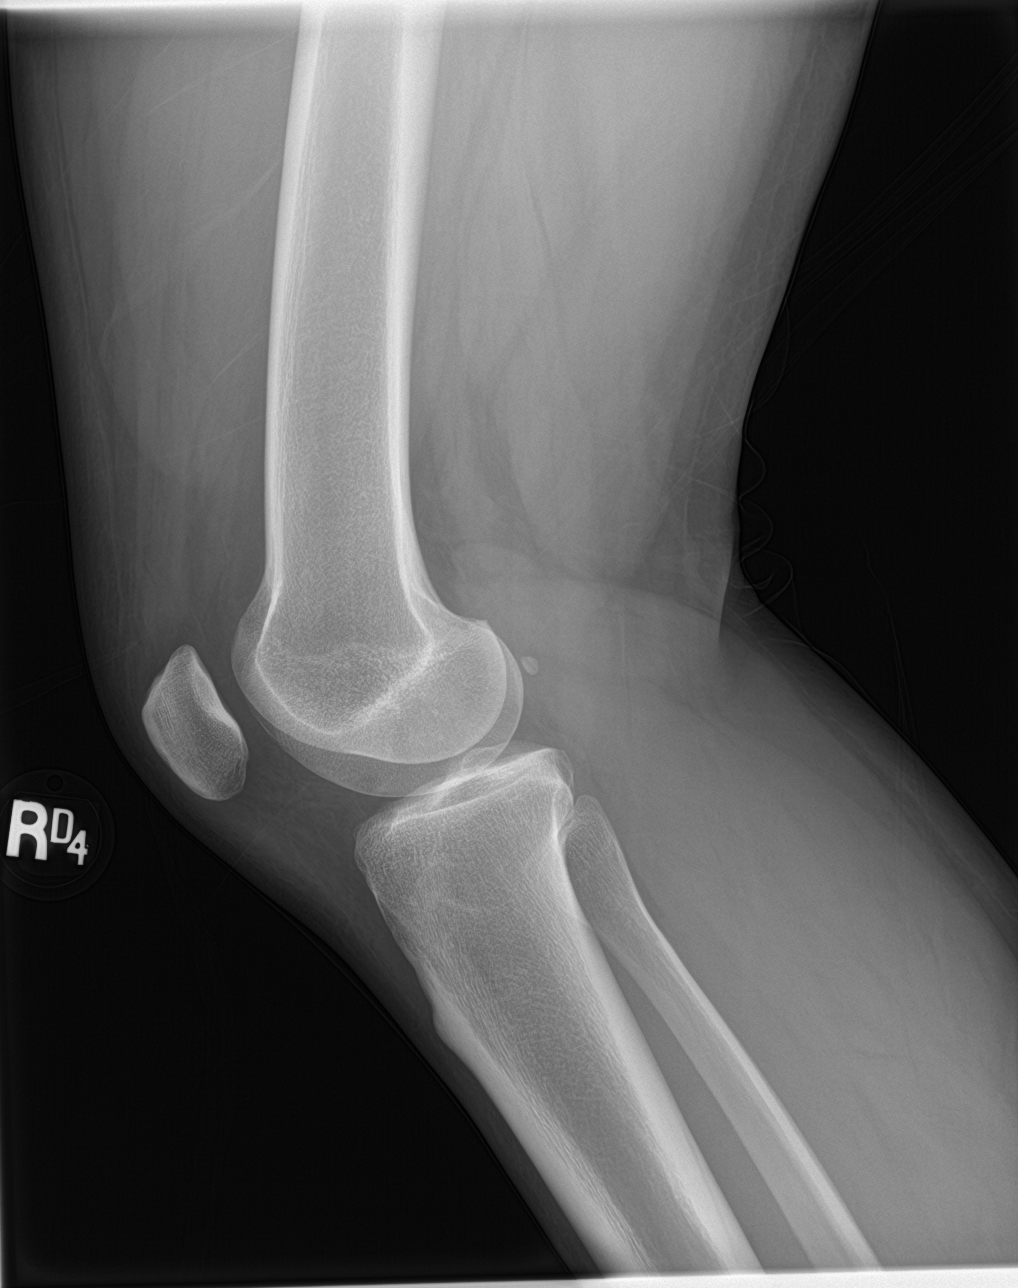

[knee obl (1 of 2)]
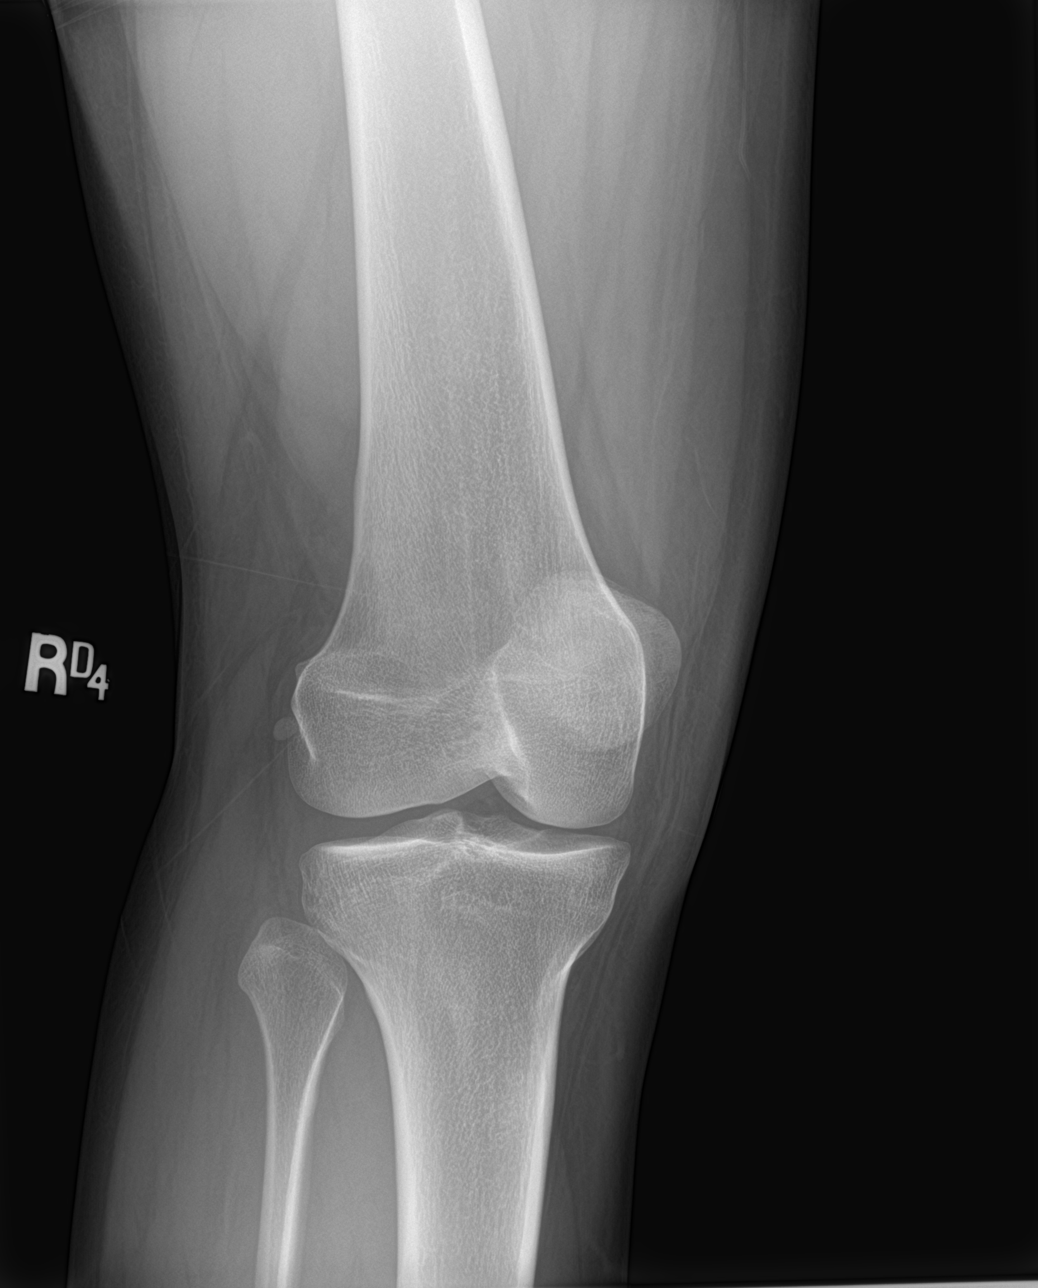

[knee obl (2 of 2)]
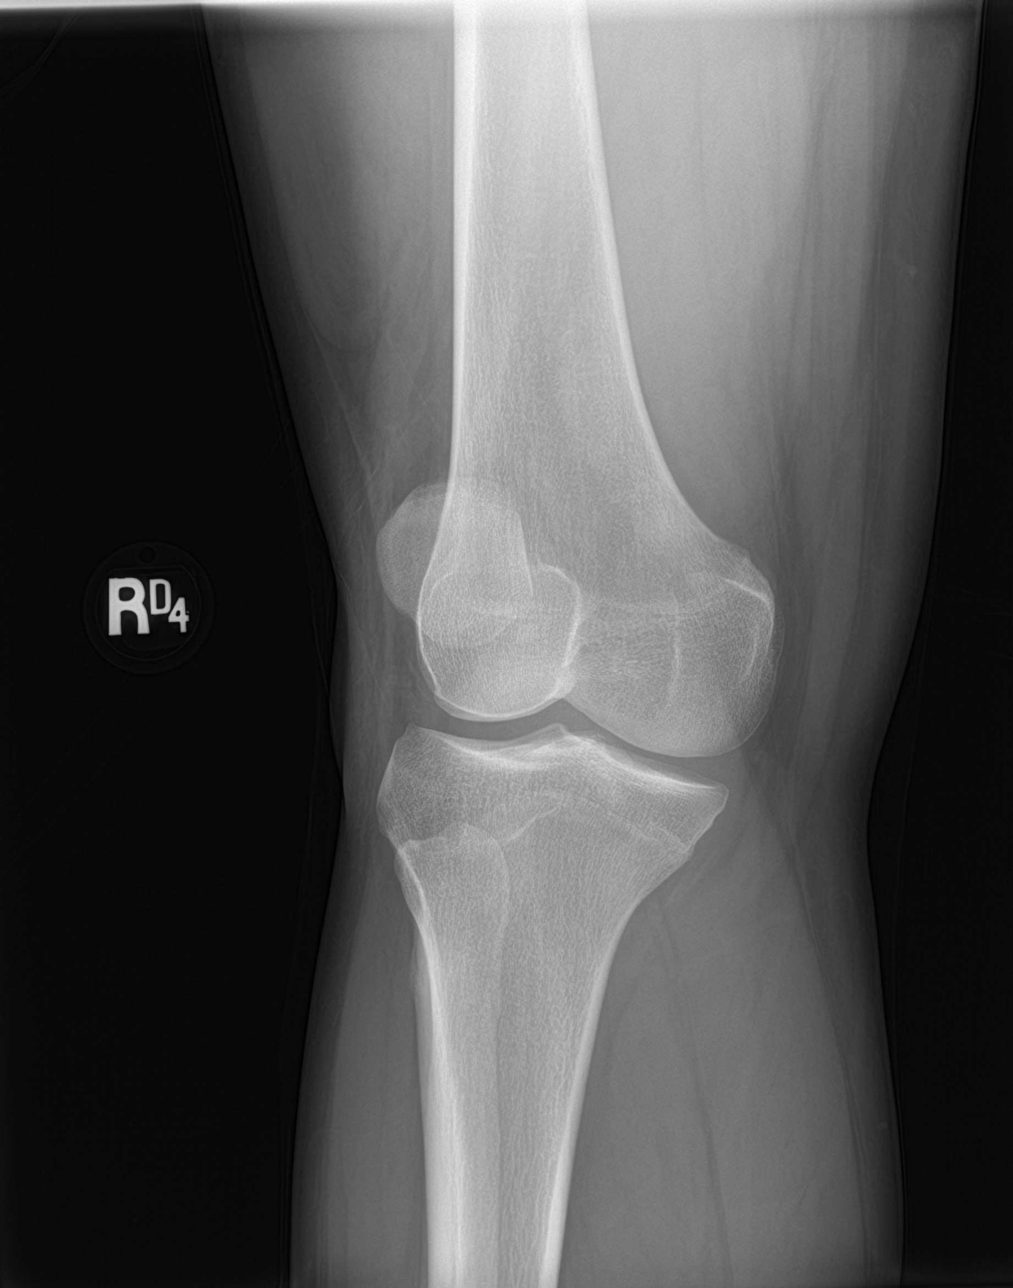

[4 of 4 positions shown; findings below may reference images not displayed]

FINDINGS: No evidence of fracture, dislocation, or joint effusion. No evidence
of arthropathy or other focal bone abnormality. Soft tissues are
unremarkable.
IMPRESSION: Negative evaluation of the RIGHT knee.

## 2022-08-28 ENCOUNTER — Ambulatory Visit
Admission: RE | Admit: 2022-08-28 | Discharge: 2022-08-28 | Disposition: A | Discharge status: Home / Self Care | Payer: Medicaid Other | Source: Ambulatory Visit | Attending: Internal Medicine | Admitting: Internal Medicine

## 2022-08-28 VITALS — BP 103/67 | HR 91 | Temp 98.3°F | Resp 18 | Ht 66.0 in | Wt 220.0 lb

## 2022-08-28 DIAGNOSIS — R519 Headache, unspecified: Secondary | ICD-10-CM

## 2022-08-28 DIAGNOSIS — L209 Atopic dermatitis, unspecified: Secondary | ICD-10-CM | POA: Diagnosis not present

## 2022-08-28 DIAGNOSIS — J069 Acute upper respiratory infection, unspecified: Secondary | ICD-10-CM

## 2022-08-28 MED ORDER — BENZONATATE 100 MG PO CAPS: 100.0000 mg | ORAL_CAPSULE | Freq: Three times a day (TID) | ORAL | 0 refills | Status: AC

## 2022-08-28 MED ORDER — IBUPROFEN 600 MG PO TABS
600.0000 mg | ORAL_TABLET | Freq: Once | ORAL | Status: AC
  Administered 2022-08-28: 600 mg via ORAL

## 2022-08-28 MED ORDER — TRIAMCINOLONE ACETONIDE 0.5 % EX OINT: 1.0000 | TOPICAL_OINTMENT | Freq: Two times a day (BID) | CUTANEOUS | 0 refills | Status: AC

## 2022-08-28 MED ORDER — PROMETHAZINE-DM 6.25-15 MG/5ML PO SYRP
5.0000 mL | ORAL_SOLUTION | Freq: Every evening | ORAL | 0 refills | Status: AC | PRN
Start: 2022-08-28 — End: ?

## 2022-08-28 NOTE — ED Provider Notes (Signed)
[facility]    CSN: [id number] Arrival date & time: 08/28/22  0801      History   Chief Complaint Chief Complaint  Patient presents with   Cough    w a sore throat, headaches, body aches - Entered by patient    HPI Shelby Blevins is a 22 y.o. female.   Patient presents to urgent care for evaluation of cough, nasal congestion, sore throat, headache, and ear fullness for the last 4 days since Monday Aug 25, 2022. Cough is sometimes dry and sometimes productive. Reports chills without known documented fever at home. Headache is to the frontal aspect of the head and currently a 10 on a scale of 0-10. Reports photophobia and phonophobia. Headache crosses midline, no history of migraines. Sore throat hurts worse with swallowing. No history of chronic respiratory problems. Non-smoker, denies drug use. No shortness of breath, chest pain, or heart palpitations.  No recent antibiotic/steroid use. No known sick contacts with similar symptoms. Using ibuprofen, tylenol, mucinex for symptoms without much relief. She has only been taking 1 pill of ibuprofen at a time and last had ibuprofen/tylenol yesterday.   Would also like evaluation for eczema to the bilateral elbows that started when it "started getting hot outside". History seasonal allergies and eczema. 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 Lubiderm fragrance free cream currenlty not working.    Cough   History reviewed. No pertinent past medical history.  There are no problems to display for this patient.   Past Surgical History:  Procedure Laterality Date   TONSILLECTOMY      OB History   No obstetric history on file.      Home Medications    Prior to Admission medications   Medication Sig Start Date End Date Taking? Authorizing Provider  benzonatate (TESSALON) 100 MG capsule Take 1 capsule (100 mg total) by mouth every 8 (eight) hours. 08/28/22  Yes Carlisle Beers, FNP   promethazine-dextromethorphan (PROMETHAZINE-DM) 6.25-15 MG/5ML syrup Take 5 mLs by mouth at bedtime as needed for cough. 08/28/22  Yes Carlisle Beers, FNP  triamcinolone ointment (KENALOG) 0.5 % Apply 1 Application topically 2 (two) times daily. 08/28/22  Yes Carlisle Beers, FNP  azithromycin (ZITHROMAX Z-PAK) 250 MG tablet Take 2 tablets (500 mg) today, then 1 tablet (250 mg) for next 4 days. 12/19/21   Immordino, Jeannett Senior, FNP  fluticasone (FLONASE) 50 MCG/ACT nasal spray Place 2 sprays into both nostrils daily. Patient not taking: Reported on 12/19/2021 12/12/21   Rising, Lurena Joiner, PA-C  predniSONE (DELTASONE) 20 MG tablet Take 60 mg daily x2 days then 40 mg daily x 2 days then 20 mg daily x 2 days 12/23/21   Charlynne Pander, MD    Family History Family History  Problem Relation Age of Onset   Healthy Mother     Social History Social History   Tobacco Use   Smoking status: Some Days   Smokeless tobacco: Never  Vaping Use   Vaping Use: Never used  Substance Use Topics   Alcohol use: Yes   Drug use: No     Allergies   Patient has no known allergies.   Review of Systems Review of Systems  Respiratory:  Positive for cough.   Per HPI   Physical Exam Triage Vital Signs ED Triage Vitals  Enc Vitals Group     BP      Pulse      Resp      Temp  Temp src      SpO2      Weight      Height      Head Circumference      Peak Flow      Pain Score      Pain Loc      Pain Edu?      Excl. in GC?    No data found.  Updated Vital Signs BP 103/67 (BP Location: Left Arm)   Pulse 91   Temp 98.3 F (36.8 C) (Oral)   Resp 18   Ht 5\' 6"  (1.676 m)   Wt 220 lb (99.8 kg)   LMP 07/21/2022 (Exact Date)   SpO2 97%   BMI 35.51 kg/m   Visual Acuity Right Eye Distance:   Left Eye Distance:   Bilateral Distance:    Right Eye Near:   Left Eye Near:    Bilateral Near:     Physical Exam Vitals and nursing note reviewed.  Constitutional:      Appearance:  She is not ill-appearing or toxic-appearing.  HENT:     Head: Normocephalic and atraumatic.     Right Ear: Hearing, tympanic membrane, ear canal and external ear normal.     Left Ear: Hearing, tympanic membrane, ear canal and external ear normal.     Nose: Congestion present.     Mouth/Throat:     Lips: Pink.     Mouth: Mucous membranes are moist. No injury.     Tongue: No lesions. Tongue does not deviate from midline.     Palate: No mass and lesions.     Pharynx: Oropharynx is clear. Uvula midline. No pharyngeal swelling, oropharyngeal exudate, posterior oropharyngeal erythema or uvula swelling.     Tonsils: No tonsillar exudate or tonsillar abscesses.  Eyes:     General: Lids are normal. Vision grossly intact. Gaze aligned appropriately.     Extraocular Movements: Extraocular movements intact.     Conjunctiva/sclera: Conjunctivae normal.  Cardiovascular:     Rate and Rhythm: Normal rate and regular rhythm.     Heart sounds: Normal heart sounds, S1 normal and S2 normal.  Pulmonary:     Effort: Pulmonary effort is normal. No respiratory distress.     Breath sounds: Normal breath sounds and air entry. No wheezing, rhonchi or rales.  Chest:     Chest wall: No tenderness.  Musculoskeletal:     Cervical back: Neck supple.  Skin:    General: Skin is warm and dry.     Capillary Refill: Capillary refill takes less than 2 seconds.     Findings: Rash present.     Comments: Skin toned maculopapular rash to the bilateral elbows without signs of skin excoriation, erythema, drainage, or warmth.  No underlying soft tissue swelling.  No rash to the other flexural surfaces of the body.  Neurological:     General: No focal deficit present.     Mental Status: She is alert and oriented to person, place, and time. Mental status is at baseline.     Cranial Nerves: No dysarthria or facial asymmetry.     Gait: Gait normal.  Psychiatric:        Mood and Affect: Mood normal.        Speech: Speech  normal.        Behavior: Behavior normal.        Thought Content: Thought content normal.        Judgment: Judgment normal.      UC Treatments /  Results  Labs (all labs ordered are listed, but only abnormal results are displayed) Labs Reviewed - No data to display  EKG   Radiology No results found.  Procedures Procedures (including critical care time)  Medications Ordered in UC Medications  ibuprofen (ADVIL) tablet 600 mg (600 mg Oral Given 08/28/22 0848)    Initial Impression / Assessment and Plan / UC Course  I have reviewed the triage vital signs and the nursing notes.  Pertinent labs & imaging results that were available during my care of the patient were reviewed by me and considered in my medical decision making (see chart for details).   1.  Viral URI with cough, bad headache Symptoms and physical exam consistent with a viral upper respiratory tract infection that will likely resolve with rest, fluids, and prescriptions for symptomatic relief. Deferred imaging based on stable cardiopulmonary exam and hemodynamically stable vital signs. Deferred viral testing using shared decision making as patient is low risk for severe disease related to potential COVID-19 illness and there is low suspicion for influenza. Discussed updated CDC guidelines for masking related to COVID-19.   Patient given ibuprofen 600mg  in clinic today for headache.   Promethazine DM and Tessalon Perles sent to pharmacy for symptomatic relief to be taken as prescribed.  May continue taking over the counter medications as directed for further symptomatic relief.  Drowsiness precautions discussed regarding promethazine DM prescription.  Nonpharmacologic interventions for symptom relief provided and after visit summary below. Advised to push fluids to stay well hydrated while recovering from viral illness.   2.  Atopic dermatitis Rash to the bilateral elbows consistent with atopic dermatitis flare.   Triamcinolone ointment twice daily for the next 5 to 7 days to treat acute flare.  Aquaphor emollient recommended to be used in between doses of triamcinolone and ongoing to prevent future eczema flares.  Advise follow-up with PCP.  No signs of secondary infection.   Discussed physical exam and available lab work findings in clinic with patient.  Counseled patient regarding appropriate use of medications and potential side effects for all medications recommended or prescribed today. Discussed red flag signs and symptoms of worsening condition,when to call the PCP office, return to urgent care, and when to seek higher level of care in the emergency department. Patient verbalizes understanding and agreement with plan. All questions answered. Patient discharged in stable condition.     Final Clinical Impressions(s) / UC Diagnoses   Final diagnoses:  Viral URI with cough  Bad headache  Atopic dermatitis in adult     Discharge Instructions      You have a viral upper respiratory infection.   Use the following medicines to help with symptoms: - Plain Mucinex (guaifenesin) over the counter as directed every 12 hours to thin mucous so that you are able to get it out of your body easier. Drink plenty of water while taking this medication so that it works well in your body (at least 8 cups a day).  - Tylenol 1,000mg  and/or ibuprofen 600mg  every 6 hours with food as needed for aches/pains or fever/chills.  - Tessalon perles every 8 hours as needed for cough. - Take Promethazine DM cough medication to help with your cough at nighttime so that you are able to sleep. Do not drive, drink alcohol, or go to work while taking this medication since it can make you sleepy. Only take this at nighttime.   1 tablespoon of honey in warm water and/or salt water gargles may also  help with symptoms. Humidifier to your room will help add water to the air and reduce coughing.  For your eczema: - Triamcinolone  ointment twice a day (every 12 hours) for the next 5-7 days to your elbows. - In between doses of triamcinolone and ongoing, use Aquaphor emollient to the elbows and other areas of eczema to prevent flare ups. - Keep showers to under 10 minutes to avoid further exposure to hot water as this drys out the skin and contributes to eczema flares.  If you develop any new or worsening symptoms, please return.  If your symptoms are severe, please go to the emergency room.  Follow-up with your primary care provider for further evaluation and management of your symptoms as well as ongoing wellness visits.  I hope you feel better!     ED Prescriptions     Medication Sig Dispense Auth. Provider   benzonatate (TESSALON) 100 MG capsule Take 1 capsule (100 mg total) by mouth every 8 (eight) hours. 21 capsule Carlisle Beers, FNP   promethazine-dextromethorphan (PROMETHAZINE-DM) 6.25-15 MG/5ML syrup Take 5 mLs by mouth at bedtime as needed for cough. 118 mL Reita May M, FNP   triamcinolone ointment (KENALOG) 0.5 % Apply 1 Application topically 2 (two) times daily. 30 g Carlisle Beers, FNP      PDMP not reviewed this encounter.   Reita May [place], [place] 08/28/22 [id number]

## 2022-08-28 NOTE — ED Triage Notes (Signed)
Patient c/o cough, congestion, sore throat, nasal drainage, body aches and headache x 4 days.  Patient states that her ears feels like she's under water.   Patient has taken Mucinex, Tylenol and Motrin.

## 2022-08-28 NOTE — Discharge Instructions (Signed)
You have a viral upper respiratory infection.   Use the following medicines to help with symptoms: - Plain Mucinex (guaifenesin) over the counter as directed every 12 hours to thin mucous so that you are able to get it out of your body easier. Drink plenty of water while taking this medication so that it works well in your body (at least 8 cups a day).  - Tylenol 1,000mg  and/or ibuprofen 600mg  every 6 hours with food as needed for aches/pains or fever/chills.  - Tessalon perles every 8 hours as needed for cough. - Take Promethazine DM cough medication to help with your cough at nighttime so that you are able to sleep. Do not drive, drink alcohol, or go to work while taking this medication since it can make you sleepy. Only take this at nighttime.   1 tablespoon of honey in warm water and/or salt water gargles may also help with symptoms. Humidifier to your room will help add water to the air and reduce coughing.  For your eczema: - Triamcinolone ointment twice a day (every 12 hours) for the next 5-7 days to your elbows. - In between doses of triamcinolone and ongoing, use Aquaphor emollient to the elbows and other areas of eczema to prevent flare ups. - Keep showers to under 10 minutes to avoid further exposure to hot water as this drys out the skin and contributes to eczema flares.  If you develop any new or worsening symptoms, please return.  If your symptoms are severe, please go to the emergency room.  Follow-up with your primary care provider for further evaluation and management of your symptoms as well as ongoing wellness visits.  I hope you feel better!

## 2022-09-01 ENCOUNTER — Ambulatory Visit
Admission: EM | Admit: 2022-09-01 | Discharge: 2022-09-01 | Disposition: A | Discharge status: Home / Self Care | Payer: Medicaid Other | Attending: Emergency Medicine | Admitting: Emergency Medicine

## 2022-09-01 ENCOUNTER — Ambulatory Visit: Payer: Self-pay

## 2022-09-01 ENCOUNTER — Telehealth: Payer: Self-pay | Admitting: Emergency Medicine

## 2022-09-01 DIAGNOSIS — J309 Allergic rhinitis, unspecified: Secondary | ICD-10-CM

## 2022-09-01 DIAGNOSIS — R0982 Postnasal drip: Secondary | ICD-10-CM

## 2022-09-01 MED ORDER — LEVOCETIRIZINE DIHYDROCHLORIDE 5 MG PO TABS: 5.0000 mg | ORAL_TABLET | Freq: Every evening | ORAL | 1 refills | Status: AC

## 2022-09-01 MED ORDER — METHYLPREDNISOLONE ACETATE 80 MG/ML IJ SUSP
60.0000 mg | Freq: Once | INTRAMUSCULAR | Status: AC
  Administered 2022-09-01: 60 mg via INTRAMUSCULAR

## 2022-09-01 MED ORDER — FLUTICASONE PROPIONATE 50 MCG/ACT NA SUSP: 1.0000 | Freq: Every day | NASAL | 1 refills | Status: AC

## 2022-09-01 NOTE — ED Triage Notes (Signed)
Pt c/o cough, sore throat and congestion x 8 days. Was seen last Thurs for sxs. Tx with tessalon and promethazine. Also taking tylenol/motrin prn. States she developed fever of 101 (after UC visit) on Saturday.

## 2022-09-01 NOTE — Discharge Instructions (Signed)
Your symptoms and my physical exam findings are concerning for exacerbation of your underlying allergies.     Please read below to learn more about the medications, dosages and frequencies that I recommend to help alleviate your symptoms and to get you feeling better soon:  Depo-Medrol IM (methylprednisolone):  To quickly address your significant respiratory inflammation, you were provided with an injection of Solu-Medrol in the office today.  You should continue to feel the full benefit of the steroid for the next 8 to 12 hours.    Xyzal (levocetirizine): This is an excellent second-generation antihistamine that helps to reduce respiratory inflammatory response to environmental allergens.  In some patients, this medication can cause daytime sleepiness so I recommend that you take 1 tablet daily at bedtime.     Flonase (fluticasone): This is a steroid nasal spray that used once daily, 1 spray in each nare.  This works best when used on a daily basis. This medication does not work well if it is only used when you think you need it.  After 3 to 5 days of use, you will notice significant reduction of the inflammation and mucus production that is currently being caused by exposure to allergens, whether seasonal or environmental.  The most common side effect of this medication is nosebleeds.  If you experience a nosebleed, please discontinue use for 1 week, then feel free to resume.  If you find that your insurance will not pay for this medication, please consider a different nasal steroids such as Nasonex (mometasone), or Nasacort (triamcinolone).   If symptoms have not meaningfully improved in the next 5 to 7 days, please return for repeat evaluation or follow-up with your regular provider.  If symptoms have worsened in the next 3 to 5 days, please return for repeat evaluation or follow-up with your regular provider.    Thank you for visiting urgent care today.  We appreciate the opportunity to participate  in your care.

## 2022-09-01 NOTE — Telephone Encounter (Signed)
Call from [facility] stating she does not feel any better w/ meds prescribed at last visit. Chart reviewed w/ provider who directed pt to return  for evaluation

## 2022-09-01 NOTE — ED Provider Notes (Signed)
[facility]    CSN: [id number] Arrival date & time: 09/01/22  1608    HISTORY   Chief Complaint  Patient presents with   Cough   HPI Shelby Blevins is a pleasant, 22 y.o. female who presents to urgent care today. Pt c/o cough, sore throat and congestion x 8 days. Was seen by provider at [facility] 5 days for same sx, was provided with rx for tessalon and promethazine DM which she states has not helped much. States also taking tylenol/motrin prn. Reports fever, Tmax 101, three days ago.   The history is provided by the patient.   History reviewed. No pertinent past medical history. There are no problems to display for this patient.  Past Surgical History:  Procedure Laterality Date   TONSILLECTOMY     OB History   No obstetric history on file.    Home Medications    Prior to Admission medications   Medication Sig Start Date End Date Taking? Authorizing Provider  benzonatate (TESSALON) 100 MG capsule Take 1 capsule (100 mg total) by mouth every 8 (eight) hours. 08/28/22   Carlisle Beers, FNP  fluticasone (FLONASE) 50 MCG/ACT nasal spray Place 2 sprays into both nostrils daily. Patient not taking: Reported on 12/19/2021 12/12/21   Rising, Lurena Joiner, PA-C  promethazine-dextromethorphan (PROMETHAZINE-DM) 6.25-15 MG/5ML syrup Take 5 mLs by mouth at bedtime as needed for cough. 08/28/22   Carlisle Beers, FNP  triamcinolone ointment (KENALOG) 0.5 % Apply 1 Application topically 2 (two) times daily. 08/28/22   Carlisle Beers, FNP    Family History Family History  Problem Relation Age of Onset   Healthy Mother    Social History Social History   Tobacco Use   Smoking status: Some Days   Smokeless tobacco: Never  Vaping Use   Vaping Use: Never used  Substance Use Topics   Alcohol use: Yes   Drug use: No   Allergies   Patient has no known allergies.  Review of Systems Review of Systems Pertinent findings revealed after performing a 14 point review  of systems has been noted in the history of present illness.  Physical Exam Vital Signs BP 116/68 (BP Location: Left Arm)   Pulse 98   Temp 98.4 F (36.9 C) (Oral)   Resp 18   LMP 07/21/2022 (Exact Date)   SpO2 97%   No data found.  Physical Exam Vitals and nursing note reviewed.  Constitutional:      General: She is not in acute distress.    Appearance: Normal appearance. She is not ill-appearing.  HENT:     Head: Normocephalic and atraumatic.     Salivary Glands: Right salivary gland is not diffusely enlarged or tender. Left salivary gland is not diffusely enlarged or tender.     Right Ear: Ear canal and external ear normal. No drainage. A middle ear effusion is present. There is no impacted cerumen. Tympanic membrane is bulging. Tympanic membrane is not injected or erythematous.     Left Ear: Ear canal and external ear normal. No drainage. A middle ear effusion is present. There is no impacted cerumen. Tympanic membrane is bulging. Tympanic membrane is not injected or erythematous.     Ears:     Comments: Bilateral EACs normal, both TMs bulging with clear fluid    Nose: Rhinorrhea present. No nasal deformity, septal deviation, signs of injury, nasal tenderness, mucosal edema or congestion. Rhinorrhea is clear.     Right Nostril: Occlusion present. No foreign body, epistaxis  or septal hematoma.     Left Nostril: Occlusion present. No foreign body, epistaxis or septal hematoma.     Right Turbinates: Enlarged, swollen and pale.     Left Turbinates: Enlarged, swollen and pale.     Right Sinus: No maxillary sinus tenderness or frontal sinus tenderness.     Left Sinus: No maxillary sinus tenderness or frontal sinus tenderness.     Mouth/Throat:     Lips: Pink. No lesions.     Mouth: Mucous membranes are moist. No oral lesions.     Pharynx: Oropharynx is clear. Uvula midline. No posterior oropharyngeal erythema or uvula swelling.     Tonsils: No tonsillar exudate. 0 on the right. 0  on the left.     Comments: Postnasal drip Eyes:     General: Lids are normal.        Right eye: No discharge.        Left eye: No discharge.     Extraocular Movements: Extraocular movements intact.     Conjunctiva/sclera: Conjunctivae normal.     Right eye: Right conjunctiva is not injected.     Left eye: Left conjunctiva is not injected.  Neck:     Trachea: Trachea and phonation normal.  Cardiovascular:     Rate and Rhythm: Normal rate and regular rhythm.     Pulses: Normal pulses.     Heart sounds: Normal heart sounds. No murmur heard.    No friction rub. No gallop.  Pulmonary:     Effort: Pulmonary effort is normal. No accessory muscle usage, prolonged expiration or respiratory distress.     Breath sounds: Normal breath sounds. No stridor, decreased air movement or transmitted upper airway sounds. No decreased breath sounds, wheezing, rhonchi or rales.  Chest:     Chest wall: No tenderness.  Musculoskeletal:        General: Normal range of motion.     Cervical back: Normal range of motion and neck supple. Normal range of motion.  Lymphadenopathy:     Cervical: No cervical adenopathy.  Skin:    General: Skin is warm and dry.     Findings: No erythema or rash.  Neurological:     General: No focal deficit present.     Mental Status: She is alert and oriented to person, place, and time.  Psychiatric:        Mood and Affect: Mood normal.        Behavior: Behavior normal.     Visual Acuity Right Eye Distance:   Left Eye Distance:   Bilateral Distance:    Right Eye Near:   Left Eye Near:    Bilateral Near:     [facility] Couse / Diagnostics / Procedures:     Radiology No results found.  Procedures Procedures (including critical care time) EKG  Pending results:  Labs Reviewed - No data to display  Medications Ordered in UC: Medications  methylPREDNISolone acetate (DEPO-MEDROL) injection 60 mg (has no administration in time range)    UC Diagnoses / Final Clinical  Impressions(s)   I have reviewed the triage vital signs and the nursing notes.  Pertinent labs & imaging results that were available during my care of the patient were reviewed by me and considered in my medical decision making (see chart for details).    Final diagnoses:  Allergic rhinitis, unspecified seasonality, unspecified trigger  Post-nasal drip   ***  Please see discharge instructions below for details of plan of care as provided to patient. ED  Prescriptions     Medication Sig Dispense Auth. Provider   levocetirizine (XYZAL) 5 MG tablet Take 1 tablet (5 mg total) by mouth every evening. 90 tablet Theadora Rama Scales, PA-C   fluticasone (FLONASE) 50 MCG/ACT nasal spray Place 1 spray into both nostrils daily. 47.4 mL Theadora Rama Scales, PA-C      PDMP not reviewed this encounter.  Pending results:  Labs Reviewed - No data to display  Discharge Instructions:   Discharge Instructions      Your symptoms and my physical exam findings are concerning for exacerbation of your underlying allergies.     Please read below to learn more about the medications, dosages and frequencies that I recommend to help alleviate your symptoms and to get you feeling better soon:  Depo-Medrol IM (methylprednisolone):  To quickly address your significant respiratory inflammation, you were provided with an injection of Solu-Medrol in the office today.  You should continue to feel the full benefit of the steroid for the next 8 to 12 hours.    Xyzal (levocetirizine): This is an excellent second-generation antihistamine that helps to reduce respiratory inflammatory response to environmental allergens.  In some patients, this medication can cause daytime sleepiness so I recommend that you take 1 tablet daily at bedtime.     Flonase (fluticasone): This is a steroid nasal spray that used once daily, 1 spray in each nare.  This works best when used on a daily basis. This medication does not work  well if it is only used when you think you need it.  After 3 to 5 days of use, you will notice significant reduction of the inflammation and mucus production that is currently being caused by exposure to allergens, whether seasonal or environmental.  The most common side effect of this medication is nosebleeds.  If you experience a nosebleed, please discontinue use for 1 week, then feel free to resume.  If you find that your insurance will not pay for this medication, please consider a different nasal steroids such as Nasonex (mometasone), or Nasacort (triamcinolone).   If symptoms have not meaningfully improved in the next 5 to 7 days, please return for repeat evaluation or follow-up with your regular provider.  If symptoms have worsened in the next 3 to 5 days, please return for repeat evaluation or follow-up with your regular provider.    Thank you for visiting urgent care today.  We appreciate the opportunity to participate in your care.       Disposition Upon Discharge:  Condition: stable for discharge home  Patient presented with an acute illness with associated systemic symptoms and significant discomfort requiring urgent management. In my opinion, this is a condition that a prudent lay person (someone who possesses an average knowledge of health and medicine) may potentially expect to result in complications if not addressed urgently such as respiratory distress, impairment of bodily function or dysfunction of bodily organs.   Routine symptom specific, illness specific and/or disease specific instructions were discussed with the patient and/or caregiver at length.   As such, the patient has been evaluated and assessed, work-up was performed and treatment was provided in alignment with urgent care protocols and evidence based medicine.  Patient/parent/caregiver has been advised that the patient may require follow up for further testing and treatment if the symptoms continue in spite of  treatment, as clinically indicated and appropriate.  Patient/parent/caregiver has been advised to return to the [facility] or PCP if no better; to PCP or the Emergency Department  if new signs and symptoms develop, or if the current signs or symptoms continue to change or worsen for further workup, evaluation and treatment as clinically indicated and appropriate  The patient will follow up with their current PCP if and as advised. If the patient does not currently have a PCP we will assist them in obtaining one.   The patient may need specialty follow up if the symptoms continue, in spite of conservative treatment and management, for further workup, evaluation, consultation and treatment as clinically indicated and appropriate.  Patient/parent/caregiver verbalized understanding and agreement of plan as discussed.  All questions were addressed during visit.  Please see discharge instructions below for further details of plan.  This office note has been dictated using Teaching laboratory technician.  Unfortunately, this method of dictation can sometimes lead to typographical or grammatical errors.  I apologize for your inconvenience in advance if this occurs.  Please do not hesitate to reach out to me if clarification is needed.
